# Patient Record
Sex: Male | Born: 1975 | Race: Black or African American | Hispanic: No | Marital: Single | Smoking: Former smoker
Health system: Southern US, Community
[De-identification: ages and names within clinical notes are randomized; demographics above are authoritative.]

## PROBLEM LIST (undated history)

## (undated) DIAGNOSIS — F32A Depression, unspecified: Secondary | ICD-10-CM

## (undated) DIAGNOSIS — T7840XA Allergy, unspecified, initial encounter: Secondary | ICD-10-CM

## (undated) DIAGNOSIS — J189 Pneumonia, unspecified organism: Secondary | ICD-10-CM

## (undated) DIAGNOSIS — L309 Dermatitis, unspecified: Secondary | ICD-10-CM

## (undated) DIAGNOSIS — J45909 Unspecified asthma, uncomplicated: Secondary | ICD-10-CM

## (undated) DIAGNOSIS — F419 Anxiety disorder, unspecified: Secondary | ICD-10-CM

## (undated) DIAGNOSIS — F329 Major depressive disorder, single episode, unspecified: Secondary | ICD-10-CM

## (undated) HISTORY — DX: Pneumonia, unspecified organism: J18.9

## (undated) HISTORY — DX: Dermatitis, unspecified: L30.9

## (undated) HISTORY — DX: Depression, unspecified: F32.A

## (undated) HISTORY — PX: NO PAST SURGERIES: SHX2092

## (undated) HISTORY — DX: Allergy, unspecified, initial encounter: T78.40XA

## (undated) HISTORY — DX: Anxiety disorder, unspecified: F41.9

---
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
## 1998-09-15 ENCOUNTER — Emergency Department (HOSPITAL_COMMUNITY): Admission: EM | Admit: 1998-09-15 | Discharge: 1998-09-15 | Payer: Self-pay | Admitting: Emergency Medicine

## 1999-12-26 ENCOUNTER — Inpatient Hospital Stay (HOSPITAL_COMMUNITY): Admission: EM | Admit: 1999-12-26 | Discharge: 1999-12-27 | Payer: Self-pay | Admitting: Emergency Medicine

## 1999-12-26 ENCOUNTER — Encounter: Payer: Self-pay | Admitting: Emergency Medicine

## 2001-07-25 ENCOUNTER — Emergency Department (HOSPITAL_COMMUNITY): Admission: EM | Admit: 2001-07-25 | Discharge: 2001-07-25 | Payer: Self-pay | Admitting: Emergency Medicine

## 2002-06-07 ENCOUNTER — Emergency Department (HOSPITAL_COMMUNITY): Admission: EM | Admit: 2002-06-07 | Discharge: 2002-06-07 | Payer: Self-pay | Admitting: Emergency Medicine

## 2005-07-10 ENCOUNTER — Emergency Department (HOSPITAL_COMMUNITY): Admission: EM | Admit: 2005-07-10 | Discharge: 2005-07-10 | Payer: Self-pay | Admitting: Emergency Medicine

## 2006-10-17 ENCOUNTER — Emergency Department (HOSPITAL_COMMUNITY): Admission: EM | Admit: 2006-10-17 | Discharge: 2006-10-17 | Payer: Self-pay | Admitting: Emergency Medicine

## 2008-08-20 ENCOUNTER — Emergency Department (HOSPITAL_COMMUNITY): Admission: EM | Admit: 2008-08-20 | Discharge: 2008-08-20 | Payer: Self-pay | Admitting: Emergency Medicine

## 2010-02-07 ENCOUNTER — Emergency Department (HOSPITAL_COMMUNITY): Admission: EM | Admit: 2010-02-07 | Discharge: 2010-02-07 | Payer: Self-pay | Admitting: Emergency Medicine

## 2010-10-03 ENCOUNTER — Emergency Department (HOSPITAL_COMMUNITY): Admission: EM | Admit: 2010-10-03 | Discharge: 2010-10-03 | Payer: Self-pay | Admitting: Emergency Medicine

## 2011-03-16 LAB — CBC
HCT: 43.8 % (ref 39.0–52.0)
Hemoglobin: 14.9 g/dL (ref 13.0–17.0)
MCHC: 34.1 g/dL (ref 30.0–36.0)
MCV: 97.7 fL (ref 78.0–100.0)
Platelets: 203 10*3/uL (ref 150–400)
RBC: 4.48 MIL/uL (ref 4.22–5.81)
RDW: 13.4 % (ref 11.5–15.5)
WBC: 6.6 10*3/uL (ref 4.0–10.5)

## 2011-03-16 LAB — DIFFERENTIAL
Eosinophils Absolute: 0.2 10*3/uL (ref 0.0–0.7)
Lymphocytes Relative: 35 % (ref 12–46)
Lymphs Abs: 2.3 10*3/uL (ref 0.7–4.0)
Neutrophils Relative %: 57 % (ref 43–77)

## 2011-03-16 LAB — POCT I-STAT, CHEM 8
BUN: 11 mg/dL (ref 6–23)
Creatinine, Ser: 0.9 mg/dL (ref 0.4–1.5)
Hemoglobin: 15.3 g/dL (ref 13.0–17.0)
Potassium: 4.1 mEq/L (ref 3.5–5.1)
Sodium: 138 mEq/L (ref 135–145)

## 2013-01-05 ENCOUNTER — Encounter (HOSPITAL_COMMUNITY): Payer: Self-pay | Admitting: Emergency Medicine

## 2013-01-05 ENCOUNTER — Emergency Department (HOSPITAL_COMMUNITY): Payer: Self-pay

## 2013-01-05 ENCOUNTER — Emergency Department (HOSPITAL_COMMUNITY)
Admission: EM | Admit: 2013-01-05 | Discharge: 2013-01-05 | Disposition: A | Discharge status: Home / Self Care | Payer: Self-pay | Attending: Emergency Medicine | Admitting: Emergency Medicine

## 2013-01-05 DIAGNOSIS — R0609 Other forms of dyspnea: Secondary | ICD-10-CM | POA: Insufficient documentation

## 2013-01-05 DIAGNOSIS — J3489 Other specified disorders of nose and nasal sinuses: Secondary | ICD-10-CM | POA: Insufficient documentation

## 2013-01-05 DIAGNOSIS — R51 Headache: Secondary | ICD-10-CM | POA: Insufficient documentation

## 2013-01-05 DIAGNOSIS — Z79899 Other long term (current) drug therapy: Secondary | ICD-10-CM | POA: Insufficient documentation

## 2013-01-05 DIAGNOSIS — R059 Cough, unspecified: Secondary | ICD-10-CM | POA: Insufficient documentation

## 2013-01-05 DIAGNOSIS — R05 Cough: Secondary | ICD-10-CM | POA: Insufficient documentation

## 2013-01-05 DIAGNOSIS — J45909 Unspecified asthma, uncomplicated: Secondary | ICD-10-CM | POA: Insufficient documentation

## 2013-01-05 DIAGNOSIS — R0602 Shortness of breath: Secondary | ICD-10-CM | POA: Insufficient documentation

## 2013-01-05 DIAGNOSIS — R0989 Other specified symptoms and signs involving the circulatory and respiratory systems: Secondary | ICD-10-CM | POA: Insufficient documentation

## 2013-01-05 HISTORY — DX: Unspecified asthma, uncomplicated: J45.909

## 2013-01-05 MED ORDER — PREDNISONE 20 MG PO TABS
60.0000 mg | ORAL_TABLET | ORAL | Status: AC
  Administered 2013-01-05: 60 mg via ORAL
  Filled 2013-01-05: qty 3

## 2013-01-05 MED ORDER — ALBUTEROL SULFATE HFA 108 (90 BASE) MCG/ACT IN AERS
2.0000 | INHALATION_SPRAY | RESPIRATORY_TRACT | Status: DC
  Administered 2013-01-05: 2 via RESPIRATORY_TRACT
  Filled 2013-01-05: qty 6.7

## 2013-01-05 MED ORDER — PREDNISONE 20 MG PO TABS: 60.0000 mg | ORAL_TABLET | Freq: Every day | ORAL | Status: AC

## 2013-01-05 MED ORDER — ALBUTEROL SULFATE (5 MG/ML) 0.5% IN NEBU
2.5000 mg | INHALATION_SOLUTION | RESPIRATORY_TRACT | Status: AC
  Administered 2013-01-05: 2.5 mg via RESPIRATORY_TRACT
  Filled 2013-01-05: qty 1

## 2013-01-05 MED ORDER — TRAMADOL HCL 50 MG PO TABS: 50.0000 mg | ORAL_TABLET | Freq: Four times a day (QID) | ORAL | Status: DC | PRN

## 2013-01-05 NOTE — ED Notes (Signed)
Noted onset of frontal forehead pain when coughing on Thursday, Rx'ed w. OTC DayQuil Max Cold & Flu which helped chills and fever and head fullness. Pt states he continues to fill like he cannot take a full deep breath and that his abd is distended.

## 2013-01-05 NOTE — Discharge Instructions (Signed)
As discussed, it is important that you follow up as soon as possible with your physician for continued management of your condition. ° °If you develop any new, or concerning changes in your condition, please return to the emergency department immediately. ° °

## 2013-01-05 NOTE — ED Provider Notes (Signed)
History     CSN: [id number]  Arrival date & time 01/05/13  1615   First MD Initiated Contact with Patient 01/05/13 1639      Chief Complaint  Patient presents with  . Cough  . Shortness of Breath    (Consider location/radiation/quality/duration/timing/severity/associated sxs/prior treatment) HPI The patient presents with concerns of ongoing dyspnea, cough, congestion, generalized discomfort.  Symptoms began approximately 3 days ago, subtly.  Since onset symptoms began significant worse, but over the past day the diffuse discomfort has improved somewhat.  The patient has been taking DayQuil. There were no clear exacerbating factors. The patient notes that he has had increasing cough, congestion, difficulty taking full inspiration. He denies focal chest pain, focal pain anywhere, lightheadedness, syncope, nausea, vomiting, diarrhea. Past Medical History  Diagnosis Date  . Asthma     History reviewed. No pertinent past surgical history.  No family history on file.  History  Substance Use Topics  . Smoking status: Never Smoker   . Smokeless tobacco: Never Used  . Alcohol Use: Yes     Comment: ocassionally beer and/or liquor      Review of Systems  Constitutional:       Per HPI, otherwise negative  HENT:       Per HPI, otherwise negative  Eyes: Negative.   Respiratory:       Per HPI, otherwise negative  Cardiovascular:       Per HPI, otherwise negative  Gastrointestinal: Negative for vomiting.  Genitourinary: Negative.   Musculoskeletal:       Per HPI, otherwise negative  Skin: Negative.   Neurological: Negative for syncope.    Allergies  Review of patient's allergies indicates no known allergies.  Home Medications   Current Outpatient Rx  Name  Route  Sig  Dispense  Refill  . EPHEDRINE-GUAIFENESIN 12.5-200 MG PO TABS   Oral   Take 1 tablet by mouth as needed. Asthma/breathing         . PSEUDOEPHEDRINE-APAP-DM 40-981-19 MG/30ML PO LIQD   Oral   Take  20 mLs by mouth every 6 (six) hours as needed. Cold symptons           BP 132/82  Pulse 105  Temp 98.6 F (37 C) (Oral)  Resp 20  SpO2 97%  Physical Exam  Nursing note and vitals reviewed. Constitutional: He is oriented to person, place, and time. He appears well-developed. No distress.  HENT:  Head: Normocephalic and atraumatic.  Eyes: Conjunctivae normal and EOM are normal.  Cardiovascular: Regular rhythm.  Tachycardia present.   Pulmonary/Chest: No stridor. Tachypnea noted. No respiratory distress. He has decreased breath sounds.  Abdominal: He exhibits no distension.  Musculoskeletal: He exhibits no edema.  Neurological: He is alert and oriented to person, place, and time.  Skin: Skin is warm and dry.  Psychiatric: He has a normal mood and affect.    ED Course  Procedures (including critical care time)  Labs Reviewed - No data to display Dg Chest 2 View  01/05/2013  *RADIOLOGY REPORT*  Clinical Data: Chest pain, cough.  CHEST - 2 VIEW  Comparison: 02/07/2010  Findings: Lungs clear.  Heart size and pulmonary vascularity normal.  No effusion.  Visualized bones unremarkable.  IMPRESSION: No acute disease   Original Report Authenticated By: D. Andria Rhein, MD      No diagnosis found.  Chest x-ray interpreted by me, no notable abnormalities.    MDM  This young male with history of asthma presents with days of generalized  discomfort, ongoing cough, congestion, difficulty with deep inspiration.  Patient does not smoke, has no risk factors for PE.  The patient also has no notable risk factors for CAD. Given his history of asthma, the ongoing dyspnea, or suspicion of reactive air disease, viral illness.  The patient improved with nebulizer treatment.  He was started on steroids, scheduled meds: Discharged in stable condition with PMD followup.      Gerhard Munch, MD 01/05/13 1902

## 2014-08-21 ENCOUNTER — Encounter (HOSPITAL_COMMUNITY): Payer: Self-pay | Admitting: Emergency Medicine

## 2014-08-21 ENCOUNTER — Emergency Department (HOSPITAL_COMMUNITY)
Admission: EM | Admit: 2014-08-21 | Discharge: 2014-08-21 | Disposition: A | Discharge status: Home / Self Care | Payer: Self-pay | Attending: Family Medicine | Admitting: Family Medicine

## 2014-08-21 DIAGNOSIS — J45909 Unspecified asthma, uncomplicated: Secondary | ICD-10-CM | POA: Insufficient documentation

## 2014-08-21 DIAGNOSIS — Y9289 Other specified places as the place of occurrence of the external cause: Secondary | ICD-10-CM | POA: Insufficient documentation

## 2014-08-21 DIAGNOSIS — X58XXXA Exposure to other specified factors, initial encounter: Secondary | ICD-10-CM | POA: Insufficient documentation

## 2014-08-21 DIAGNOSIS — S61002A Unspecified open wound of left thumb without damage to nail, initial encounter: Secondary | ICD-10-CM

## 2014-08-21 DIAGNOSIS — S61209A Unspecified open wound of unspecified finger without damage to nail, initial encounter: Secondary | ICD-10-CM | POA: Insufficient documentation

## 2014-08-21 DIAGNOSIS — Y9389 Activity, other specified: Secondary | ICD-10-CM | POA: Insufficient documentation

## 2014-08-21 DIAGNOSIS — Y990 Civilian activity done for income or pay: Secondary | ICD-10-CM | POA: Insufficient documentation

## 2014-08-21 DIAGNOSIS — Z23 Encounter for immunization: Secondary | ICD-10-CM | POA: Insufficient documentation

## 2014-08-21 MED ORDER — MUPIROCIN 2 % EX OINT: 1.0000 "application " | TOPICAL_OINTMENT | Freq: Two times a day (BID) | CUTANEOUS | Status: DC

## 2014-08-21 MED ORDER — HYDROCODONE-ACETAMINOPHEN 5-325 MG PO TABS: 1.0000 | ORAL_TABLET | Freq: Four times a day (QID) | ORAL | Status: DC | PRN

## 2014-08-21 MED ORDER — TETANUS-DIPHTH-ACELL PERTUSSIS 5-2.5-18.5 LF-MCG/0.5 IM SUSP
0.5000 mL | Freq: Once | INTRAMUSCULAR | Status: AC
  Administered 2014-08-21: 0.5 mL via INTRAMUSCULAR
  Filled 2014-08-21: qty 0.5

## 2014-08-21 NOTE — ED Notes (Signed)
Pt c/o laceration to left thumb tip; bleeding controlled; unknown last DT

## 2014-08-21 NOTE — ED Provider Notes (Signed)
CSN: [id number]     Arrival date & time 08/21/14  1446 History   First MD Initiated Contact with Patient 08/21/14 1552     Chief Complaint  Patient presents with  . Extremity Laceration     (Consider location/radiation/quality/duration/timing/severity/associated sxs/prior Treatment) HPI Comments: 38 year old male presents for evaluation of a laceration to the tip of his left thumb. Earlier today he sliced the tip of his thumb off at work. It is painful and was bleeding, but the bleeding has been controlled with direct pressure. He is not taking any blood thinners. He denies any other injury.   Past Medical History  Diagnosis Date  . Asthma    History reviewed. No pertinent past surgical history. History reviewed. No pertinent family history. History  Substance Use Topics  . Smoking status: Never Smoker   . Smokeless tobacco: Never Used  . Alcohol Use: Yes     Comment: ocassionally beer and/or liquor    Review of Systems  Skin: Positive for wound (see history of present illness).  All other systems reviewed and are negative.     Allergies  Review of patient's allergies indicates no known allergies.  Home Medications   Prior to Admission medications   Not on File   BP 121/67  Pulse 70  Temp(Src) 97.9 F (36.6 C) (Oral)  Resp 18  SpO2 96% Physical Exam  Nursing note and vitals reviewed. Constitutional: He is oriented to person, place, and time. He appears well-developed and well-nourished. No distress.  HENT:  Head: Normocephalic.  Pulmonary/Chest: Effort normal. No respiratory distress.  Musculoskeletal:       Left hand: He exhibits laceration (avulsed tip left thumb,  subcutaneous tissue exposed underneath). He exhibits normal capillary refill. Normal sensation noted. Normal strength noted.  Neurological: He is alert and oriented to person, place, and time. Coordination normal.  Skin: Skin is warm and dry. No rash noted. He is not diaphoretic.  Psychiatric: He  has a normal mood and affect. Judgment normal.    ED Course  Procedures (including critical care time) Labs Review Labs Reviewed - No data to display  Imaging Review No results found.   EKG Interpretation None      MDM   Final diagnoses:  None    Digital block with 5 mL of 2% lidocaine without epinephrine. The wound bed was cauterized with silver nitrate and dressed with Xeroform gauze. Patient instructed in proper wound care. He will keep it clean, dry, covered. Antibiotic ointment to use for a few days.  Meds ordered this encounter  Medications  . Tdap (BOOSTRIX) injection 0.5 mL    Sig:   . mupirocin ointment (BACTROBAN) 2 %    Sig: Apply 1 application topically 2 (two) times daily.    Dispense:  15 g    Refill:  0    Order Specific Question:  Supervising Provider    Answer:  Billy Fischer [phone]  . HYDROcodone-acetaminophen (NORCO) 5-325 MG per tablet    Sig: Take 1 tablet by mouth every 6 (six) hours as needed for moderate pain.    Dispense:  10 tablet    Refill:  0    Order Specific Question:  Supervising Provider    Answer:  Ihor Gully D Luna, PA-C 08/21/14 [phone]

## 2014-08-21 NOTE — Discharge Instructions (Signed)
Fingertip Injuries and Amputations °Fingertip injuries are common and often get injured because they are last to escape when pulling your hand out of harm's way. You have amputated (cut off) part of your finger. How this turns out depends largely on how much was amputated. If just the tip is amputated, often the end of the finger will grow back and the finger may return to much the same as it was before the injury.  °If more of the finger is missing, your caregiver has done the best with the tissue remaining to allow you to keep as much finger as is possible. Your caregiver after checking your injury has tried to leave you with a painless fingertip that has durable, feeling skin. If possible, your caregiver has tried to maintain the finger's length and appearance and preserve its fingernail.  °Please read the instructions outlined below and refer to this sheet in the next few weeks. These instructions provide you with general information on caring for yourself. Your caregiver may also give you specific instructions. While your treatment has been done according to the most current medical practices available, unavoidable complications occasionally occur. If you have any problems or questions after discharge, please call your caregiver. °HOME CARE INSTRUCTIONS  °· You may resume normal diet and activities as directed or allowed. °· Keep your hand elevated above the level of your heart. This helps decrease pain and swelling. °· Keep ice packs (or a bag of ice wrapped in a towel) on the injured area for 15-20 minutes, 03-04 times per day, for the first two days. °· Change dressings if necessary or as directed. °· Clean the wound daily or as directed. °· Only take over-the-counter or prescription medicines for pain, discomfort, or fever as directed by your caregiver. °· Keep appointments as directed. °SEEK IMMEDIATE MEDICAL CARE IF: °· You develop redness, swelling, numbness or increasing pain in the wound. °· There is  pus coming from the wound. °· You develop an unexplained oral temperature above 102° F (38.9° C) or as your caregiver suggests. °· There is a foul (bad) smell coming from the wound or dressing. °· There is a breaking open of the wound (edges not staying together) after sutures or staples have been removed. °MAKE SURE YOU:  °· Understand these instructions. °· Will watch your condition. °· Will get help right away if you are not doing well or get worse. °Document Released: 11/01/2005 Document Revised: 03/04/2012 Document Reviewed: 09/30/2008 °[employer]® Patient Information ©2015 [employer]. This information is not intended to replace advice given to you by your health care provider. Make sure you discuss any questions you have with your health care provider. ° °

## 2014-08-25 NOTE — ED Provider Notes (Signed)
Medical screening examination/treatment/procedure(s) were performed by resident physician or non-physician practitioner and as supervising physician I was immediately available for consultation/collaboration.   Pauline Good MD.   Billy Fischer, MD 08/25/14 1228

## 2015-03-14 ENCOUNTER — Emergency Department (HOSPITAL_COMMUNITY)
Admission: EM | Admit: 2015-03-14 | Discharge: 2015-03-14 | Disposition: A | Discharge status: Home / Self Care | Payer: BLUE CROSS/BLUE SHIELD | Attending: Emergency Medicine | Admitting: Emergency Medicine

## 2015-03-14 ENCOUNTER — Encounter (HOSPITAL_COMMUNITY): Payer: Self-pay | Admitting: *Deleted

## 2015-03-14 DIAGNOSIS — T23131A Burn of first degree of multiple right fingers (nail), not including thumb, initial encounter: Secondary | ICD-10-CM | POA: Insufficient documentation

## 2015-03-14 DIAGNOSIS — Y998 Other external cause status: Secondary | ICD-10-CM | POA: Insufficient documentation

## 2015-03-14 DIAGNOSIS — J45909 Unspecified asthma, uncomplicated: Secondary | ICD-10-CM | POA: Insufficient documentation

## 2015-03-14 DIAGNOSIS — T23091A Burn of unspecified degree of multiple sites of right wrist and hand, initial encounter: Secondary | ICD-10-CM | POA: Diagnosis present

## 2015-03-14 DIAGNOSIS — T23101A Burn of first degree of right hand, unspecified site, initial encounter: Secondary | ICD-10-CM

## 2015-03-14 DIAGNOSIS — Z792 Long term (current) use of antibiotics: Secondary | ICD-10-CM | POA: Insufficient documentation

## 2015-03-14 DIAGNOSIS — Y93G3 Activity, cooking and baking: Secondary | ICD-10-CM | POA: Diagnosis not present

## 2015-03-14 DIAGNOSIS — Y929 Unspecified place or not applicable: Secondary | ICD-10-CM | POA: Insufficient documentation

## 2015-03-14 DIAGNOSIS — X12XXXA Contact with other hot fluids, initial encounter: Secondary | ICD-10-CM | POA: Diagnosis not present

## 2015-03-14 MED ORDER — HYDROCODONE-ACETAMINOPHEN 5-325 MG PO TABS: 1.0000 | ORAL_TABLET | ORAL | Status: DC | PRN

## 2015-03-14 MED ORDER — SILVER SULFADIAZINE 1 % EX CREA
TOPICAL_CREAM | Freq: Once | CUTANEOUS | Status: AC
  Administered 2015-03-14: 21:00:00 via TOPICAL
  Filled 2015-03-14: qty 85

## 2015-03-14 MED ORDER — OXYCODONE-ACETAMINOPHEN 5-325 MG PO TABS
1.0000 | ORAL_TABLET | Freq: Once | ORAL | Status: AC
  Administered 2015-03-14: 1 via ORAL
  Filled 2015-03-14 (×2): qty 1

## 2015-03-14 NOTE — ED Provider Notes (Signed)
CSN: [id number]     Arrival date & time 03/14/15  1930 History  This chart was scribed for non-physician practitioner, Lucien Mons, PA-C,working with Noemi Chapel, MD, by Marlowe Kays, ED Scribe. This patient was seen in room TR08C/TR08C and the patient's care was started at 8:03 PM.  Chief Complaint  Patient presents with  . Hand Burn   The history is provided by the patient and medical records. No language interpreter was used.    HPI Comments:  William Bowman is a 39 y.o. male who presents to the Emergency Department complaining of a burn from grease while cooking to his right hand approximately 45 minutes ago. He reports severe pain. He has placed ice to the area with some relief of the pain. Denies modifying factors. Denies numbness, tingling or weakness of the right hand, fever, chills, nausea or vomiting. He states his tetanus vaccination is UTD 08/2014. Pt is right-hand dominant. PMHx of asthma.   Past Medical History  Diagnosis Date  . Asthma    History reviewed. No pertinent past surgical history. History reviewed. No pertinent family history. History  Substance Use Topics  . Smoking status: Never Smoker   . Smokeless tobacco: Never Used  . Alcohol Use: Yes     Comment: ocassionally beer and/or liquor    Review of Systems  Constitutional: Negative for fever and chills.  Gastrointestinal: Negative for nausea and vomiting.  Skin: Positive for color change and wound.  Neurological: Negative for weakness and numbness.  All other systems reviewed and are negative.   Allergies  Review of patient's allergies indicates no known allergies.  Home Medications   Prior to Admission medications   Medication Sig Start Date End Date Taking? Authorizing Provider  HYDROcodone-acetaminophen (NORCO/VICODIN) 5-325 MG per tablet Take 1-2 tablets by mouth every 4 (four) hours as needed. 03/14/15   Carman Ching, PA-C  mupirocin ointment (BACTROBAN) 2 % Apply 1 application topically 2  (two) times daily. 08/21/14   Liam Graham, PA-C   Triage Vitals: BP 130/77 mmHg  Pulse 81  Temp(Src) 97.5 F (36.4 C) (Oral)  Resp 22  SpO2 99% Physical Exam  Constitutional: He is oriented to person, place, and time. He appears well-developed and well-nourished. No distress.  HENT:  Head: Normocephalic and atraumatic.  Eyes: Conjunctivae and EOM are normal.  Neck: Normal range of motion. Neck supple.  Cardiovascular: Normal rate, regular rhythm and normal heart sounds.   Pulmonary/Chest: Effort normal and breath sounds normal.  Musculoskeletal: Normal range of motion. He exhibits no edema.  Neurological: He is alert and oriented to person, place, and time.  Skin: Skin is warm and dry.  Erythema to the top part of patient's palm, erythema and small amount of blistering to the distal tip of all fingers. Skin intact. Tender. Two point discrimination intact. Cap refill < 3 seconds.  Psychiatric: He has a normal mood and affect. His behavior is normal.  Nursing note and vitals reviewed.   ED Course  Procedures (including critical care time) DIAGNOSTIC STUDIES: Oxygen Saturation is 99% on RA, normal by my interpretation.   COORDINATION OF CARE: 8:06 PM- Will prescribe pain medication and silvadene cream, NSAIDs, ice. Pt verbalizes understanding and agrees to plan.  Medications  silver sulfADIAZINE (SILVADENE) 1 % cream (not administered)    Labs Review Labs Reviewed - No data to display  Imaging Review No results found.   EKG Interpretation None      MDM   Final diagnoses:  Burn of  right hand including fingers, first degree, initial encounter   Neurovascularly intact. Stable for d/c. Return precautions given. Patient states understanding of treatment care plan and is agreeable.  I personally performed the services described in this documentation, which was scribed in my presence. The recorded information has been reviewed and is accurate.    Carman Ching,  PA-C 03/14/15 2029  Noemi Chapel, MD 03/15/15 [phone]

## 2015-03-14 NOTE — Discharge Instructions (Signed)
Take Vicodin for severe pain only. No driving or operating heavy machinery while taking vicodin. This medication may cause drowsiness. Apply silvadene cream twice daily. Continue to ice your hand. Take ibuprofen 60-800 mg every 6-8 hours.  Burn Care Your skin is a natural barrier to infection. It is the largest organ of your body. Burns damage this natural protection. To help prevent infection, it is very important to follow your caregiver's instructions in the care of your burn. Burns are classified as:  First degree. There is only redness of the skin (erythema). No scarring is expected.  Second degree. There is blistering of the skin. Scarring may occur with deeper burns.  Third degree. All layers of the skin are injured, and scarring is expected. HOME CARE INSTRUCTIONS   Wash your hands well before changing your bandage.  Change your bandage as often as directed by your caregiver.  Remove the old bandage. If the bandage sticks, you may soak it off with cool, clean water.  Cleanse the burn thoroughly but gently with mild soap and water.  Pat the area dry with a clean, dry cloth.  Apply a thin layer of antibacterial cream to the burn.  Apply a clean bandage as instructed by your caregiver.  Keep the bandage as clean and dry as possible.  Elevate the affected area for the first 24 hours, then as instructed by your caregiver.  Only take over-the-counter or prescription medicines for pain, discomfort, or fever as directed by your caregiver. SEEK IMMEDIATE MEDICAL CARE IF:   You develop excessive pain.  You develop redness, tenderness, swelling, or red streaks near the burn.  The burned area develops yellowish-white fluid (pus) or a bad smell.  You have a fever. MAKE SURE YOU:   Understand these instructions.  Will watch your condition.  Will get help right away if you are not doing well or get worse. Document Released: 12/11/2005 Document Revised: 03/04/2012 Document  Reviewed: 05/03/2011 [facility] Patient Information 2015 [place], [place]. This information is not intended to replace advice given to you by your health care provider. Make sure you discuss any questions you have with your health care provider.

## 2015-03-14 NOTE — ED Notes (Signed)
Pt in c/o burn to his right hand, states he had hot grease splatter his hand, skin intact with some blisters noted

## 2015-04-12 ENCOUNTER — Emergency Department (HOSPITAL_COMMUNITY)
Admission: EM | Admit: 2015-04-12 | Discharge: 2015-04-13 | Disposition: A | Discharge status: Home / Self Care | Payer: BLUE CROSS/BLUE SHIELD | Attending: Emergency Medicine | Admitting: Emergency Medicine

## 2015-04-12 ENCOUNTER — Encounter (HOSPITAL_COMMUNITY): Payer: Self-pay | Admitting: Emergency Medicine

## 2015-04-12 ENCOUNTER — Emergency Department (HOSPITAL_COMMUNITY): Payer: BLUE CROSS/BLUE SHIELD

## 2015-04-12 DIAGNOSIS — Z792 Long term (current) use of antibiotics: Secondary | ICD-10-CM | POA: Diagnosis not present

## 2015-04-12 DIAGNOSIS — J4531 Mild persistent asthma with (acute) exacerbation: Secondary | ICD-10-CM | POA: Diagnosis not present

## 2015-04-12 DIAGNOSIS — J4541 Moderate persistent asthma with (acute) exacerbation: Secondary | ICD-10-CM

## 2015-04-12 DIAGNOSIS — J45909 Unspecified asthma, uncomplicated: Secondary | ICD-10-CM | POA: Diagnosis present

## 2015-04-12 DIAGNOSIS — J22 Unspecified acute lower respiratory infection: Secondary | ICD-10-CM | POA: Diagnosis not present

## 2015-04-12 LAB — CBC WITH DIFFERENTIAL/PLATELET
BASOS ABS: 0 10*3/uL (ref 0.0–0.1)
BASOS ABS: 0 10*3/uL (ref 0.0–0.1)
Basophils Relative: 0 % (ref 0–1)
Basophils Relative: 0 % (ref 0–1)
EOS PCT: 0 % (ref 0–5)
EOS PCT: 0 % (ref 0–5)
Eosinophils Absolute: 0 10*3/uL (ref 0.0–0.7)
Eosinophils Absolute: 0 10*3/uL (ref 0.0–0.7)
HCT: 41.6 % (ref 39.0–52.0)
HEMATOCRIT: 41.4 % (ref 39.0–52.0)
HEMOGLOBIN: 14.5 g/dL (ref 13.0–17.0)
Hemoglobin: 14.1 g/dL (ref 13.0–17.0)
LYMPHS ABS: 0.8 10*3/uL (ref 0.7–4.0)
LYMPHS PCT: 12 % (ref 12–46)
LYMPHS PCT: 12 % (ref 12–46)
Lymphs Abs: 0.8 10*3/uL (ref 0.7–4.0)
MCH: 32.2 pg (ref 26.0–34.0)
MCH: 31.4 pg (ref 26.0–34.0)
MCHC: 34.9 g/dL (ref 30.0–36.0)
MCHC: 34.1 g/dL (ref 30.0–36.0)
MCV: 92.2 fL (ref 78.0–100.0)
MCV: 92.2 fL (ref 78.0–100.0)
MONO ABS: 0.3 10*3/uL (ref 0.1–1.0)
MONO ABS: 0.2 10*3/uL (ref 0.1–1.0)
MONOS PCT: 5 % (ref 3–12)
MONOS PCT: 3 % (ref 3–12)
NEUTROS ABS: 5.1 10*3/uL (ref 1.7–7.7)
NEUTROS ABS: 5.5 10*3/uL (ref 1.7–7.7)
NEUTROS PCT: 83 % — AB (ref 43–77)
Neutrophils Relative %: 85 % — ABNORMAL HIGH (ref 43–77)
PLATELETS: 176 10*3/uL (ref 150–400)
Platelets: 178 10*3/uL (ref 150–400)
RBC: 4.51 MIL/uL (ref 4.22–5.81)
RBC: 4.49 MIL/uL (ref 4.22–5.81)
RDW: 13.2 % (ref 11.5–15.5)
RDW: 13.1 % (ref 11.5–15.5)
WBC: 6.2 10*3/uL (ref 4.0–10.5)
WBC: 6.5 10*3/uL (ref 4.0–10.5)

## 2015-04-12 LAB — D-DIMER, QUANTITATIVE (NOT AT ARMC)

## 2015-04-12 MED ORDER — AZITHROMYCIN 250 MG PO TABS
500.0000 mg | ORAL_TABLET | Freq: Once | ORAL | Status: AC
  Administered 2015-04-12: 500 mg via ORAL
  Filled 2015-04-12: qty 2

## 2015-04-12 MED ORDER — IPRATROPIUM-ALBUTEROL 0.5-2.5 (3) MG/3ML IN SOLN
3.0000 mL | Freq: Once | RESPIRATORY_TRACT | Status: AC
  Administered 2015-04-12: 3 mL via RESPIRATORY_TRACT
  Filled 2015-04-12: qty 3

## 2015-04-12 MED ORDER — IPRATROPIUM-ALBUTEROL 0.5-2.5 (3) MG/3ML IN SOLN
3.0000 mL | Freq: Once | RESPIRATORY_TRACT | Status: AC
  Administered 2015-04-12: 3 mL via RESPIRATORY_TRACT

## 2015-04-12 MED ORDER — IPRATROPIUM-ALBUTEROL 0.5-2.5 (3) MG/3ML IN SOLN
RESPIRATORY_TRACT | Status: AC
  Administered 2015-04-12: 3 mL
  Filled 2015-04-12: qty 3

## 2015-04-12 MED ORDER — SODIUM CHLORIDE 0.9 % IV BOLUS (SEPSIS)
1000.0000 mL | Freq: Once | INTRAVENOUS | Status: AC
  Administered 2015-04-12: 1000 mL via INTRAVENOUS

## 2015-04-12 MED ORDER — IPRATROPIUM-ALBUTEROL 0.5-2.5 (3) MG/3ML IN SOLN
3.0000 mL | Freq: Once | RESPIRATORY_TRACT | Status: AC
  Administered 2015-04-13: 3 mL via RESPIRATORY_TRACT
  Filled 2015-04-12: qty 3

## 2015-04-12 MED ORDER — PREDNISONE 20 MG PO TABS
60.0000 mg | ORAL_TABLET | Freq: Once | ORAL | Status: AC
Start: 2015-04-12 — End: 2015-04-12
  Administered 2015-04-12: 60 mg via ORAL
  Filled 2015-04-12: qty 3

## 2015-04-12 NOTE — ED Notes (Signed)
Pt sts increased SOB and wheezing from asthma; pt sts hx of same; pt sts pain with cough

## 2015-04-12 NOTE — ED Notes (Signed)
Phlebotomy at the bedside  

## 2015-04-12 NOTE — ED Provider Notes (Signed)
CSN: [id number]     Arrival date & time 04/12/15  1529 History   First MD Initiated Contact with Patient 04/12/15 1858     Chief Complaint  Patient presents with  . Asthma     (Consider location/radiation/quality/duration/timing/severity/associated sxs/prior Treatment) HPI The patient reports he's had a cough for about a week. He states on the first day he had some sinus congestion and drainage and tried some over-the-counter decongestant. He reports he continued to get worse until he was having a lot of coughing as well as some body aches. He has not had a fever that he is aware of however he does report he's been having chills. The patient reports that he's been wheezing and his cough has been nonproductive. The cough is worse at night. The patient is a nonsmoker. He reports he's had asthma since he was a teenager however does not get routine maintenance care. He reports he is only needed inhalers periodically when he gets a respiratory infection. He states that he is respiratory infection typically it is in April or December. Past Medical History  Diagnosis Date  . Asthma    History reviewed. No pertinent past surgical history. History reviewed. No pertinent family history. History  Substance Use Topics  . Smoking status: Never Smoker   . Smokeless tobacco: Never Used  . Alcohol Use: Yes     Comment: ocassionally beer and/or liquor    Review of Systems 10 Systems reviewed and are negative for acute change except as noted in the HPI.   Allergies  Review of patient's allergies indicates no known allergies.  Home Medications   Prior to Admission medications   Medication Sig Start Date End Date Taking? Authorizing Provider  cetirizine (ZYRTEC) 10 MG tablet Take 10 mg by mouth daily as needed for allergies.   Yes Historical Provider, MD  HYDROcodone-acetaminophen (NORCO/VICODIN) 5-325 MG per tablet Take 1-2 tablets by mouth every 4 (four) hours as needed. Patient not taking:  Reported on 04/12/2015 03/14/15   Carman Ching, PA-C  mupirocin ointment (BACTROBAN) 2 % Apply 1 application topically 2 (two) times daily. Patient not taking: Reported on 04/12/2015 08/21/14   Liam Graham, PA-C   BP 119/69 mmHg  Pulse 108  Temp(Src) 99.3 F (37.4 C) (Oral)  Resp 29  Ht 5\' 7"  (1.702 m)  Wt 225 lb 4 oz (102.173 kg)  BMI 35.27 kg/m2  SpO2 92% Physical Exam  Constitutional: He is oriented to person, place, and time. He appears well-developed and well-nourished.  Patient is alert and nontoxic. No respiratory distress at rest.  HENT:  Head: Normocephalic and atraumatic.  Eyes: EOM are normal. Pupils are equal, round, and reactive to light.  Neck: Neck supple.  Cardiovascular: Normal rate, regular rhythm, normal heart sounds and intact distal pulses.   Pulmonary/Chest: Effort normal. He has no rales.  Patient has very coarse wheeze throughout. There is adequate air flow to the bases.  Abdominal: Soft. Bowel sounds are normal. He exhibits no distension. There is no tenderness.  Musculoskeletal: Normal range of motion. He exhibits no edema.  Neurological: He is alert and oriented to person, place, and time. He has normal strength. Coordination normal. GCS eye subscore is 4. GCS verbal subscore is 5. GCS motor subscore is 6.  Skin: Skin is warm, dry and intact.  Psychiatric: He has a normal mood and affect.    ED Course  Procedures (including critical care time) Labs Review Labs Reviewed  CBC WITH DIFFERENTIAL/PLATELET - Abnormal; Notable  for the following:    Neutrophils Relative % 83 (*)    All other components within normal limits  CBC WITH DIFFERENTIAL/PLATELET - Abnormal; Notable for the following:    Neutrophils Relative % 85 (*)    All other components within normal limits  CULTURE, BLOOD (ROUTINE X 2)  CULTURE, BLOOD (ROUTINE X 2)  D-DIMER, QUANTITATIVE  COMPREHENSIVE METABOLIC PANEL    Imaging Review Dg Chest 2 View (if Patient Has Fever And/or  Copd)  04/12/2015   CLINICAL DATA:  Asthma, chest pain, coughing  EXAM: CHEST  2 VIEW  COMPARISON:  01/05/2013  FINDINGS: Cardiomediastinal silhouette is stable. No acute infiltrate or pleural effusion. No pulmonary edema. Bony thorax is unremarkable.  IMPRESSION: No active cardiopulmonary disease.   Electronically Signed   By: Lahoma Crocker M.D.   On: 04/12/2015 16:46     EKG Interpretation   Date/Time:  Monday April 12 2015 19:02:08 EDT Ventricular Rate:  104 PR Interval:  150 QRS Duration: 84 QT Interval:  323 QTC Calculation: 425 R Axis:   -48 Text Interpretation:  Sinus tachycardia otherwise normal. no change from  previous Confirmed by Johnney Killian, MD, Jeannie Done [phone]) on 04/12/2015 7:45:27 PM     Recheck 00:15 patient now feels much improved after 3 DuoNeb's and prednisone and Zithromax. MDM   Final diagnoses:  Asthma, moderate persistent, with acute exacerbation  Lower respiratory infection (e.g., bronchitis, pneumonia, pneumonitis, pulmonitis)   D-dimer is negative. White count is not elevated and chest x-ray does not show focal pneumonia. Patient did get significant improvement with 3 DuoNeb's. He does have asthma and diffuse wheezing however at this point is not showing signs of respiratory distress and feels safe for continued outpatient management. He is counseled to return if there should be worsening or changing symptoms.    Charlesetta Shanks, MD 04/13/15 [phone]

## 2015-04-12 NOTE — ED Notes (Signed)
MD made aware of patient's VS.

## 2015-04-12 NOTE — ED Notes (Addendum)
  Pt has multiple complaints, including a "tightness and stretching" in his "core", "unable to breath" and generalized aching.    Pt sts "I feel like my whole body is cramping, mostly my right side, that is always tense and feels like I have a muscle cramp.  Then sometimes in the middle of the night I feel like it's so tight I can't even breath.  I can't get a full breath because it feels like something is pulling so hard on my right side that it's tucking it in".

## 2015-04-13 LAB — COMPREHENSIVE METABOLIC PANEL
ALK PHOS: 40 U/L (ref 39–117)
ALT: 32 U/L (ref 0–53)
ANION GAP: 11 (ref 5–15)
AST: 28 U/L (ref 0–37)
Albumin: 3.9 g/dL (ref 3.5–5.2)
BILIRUBIN TOTAL: 0.8 mg/dL (ref 0.3–1.2)
BUN: 6 mg/dL (ref 6–23)
CHLORIDE: 102 mmol/L (ref 96–112)
CO2: 22 mmol/L (ref 19–32)
Calcium: 8.6 mg/dL (ref 8.4–10.5)
Creatinine, Ser: 0.87 mg/dL (ref 0.50–1.35)
GLUCOSE: 109 mg/dL — AB (ref 70–99)
POTASSIUM: 3.8 mmol/L (ref 3.5–5.1)
SODIUM: 135 mmol/L (ref 135–145)
Total Protein: 7 g/dL (ref 6.0–8.3)

## 2015-04-13 MED ORDER — PREDNISONE 20 MG PO TABS: ORAL_TABLET | ORAL | Status: DC

## 2015-04-13 MED ORDER — ALBUTEROL SULFATE HFA 108 (90 BASE) MCG/ACT IN AERS: 2.0000 | INHALATION_SPRAY | RESPIRATORY_TRACT | Status: DC | PRN

## 2015-04-13 MED ORDER — AZITHROMYCIN 250 MG PO TABS: 250.0000 mg | ORAL_TABLET | Freq: Every day | ORAL | Status: DC

## 2015-04-13 NOTE — Discharge Instructions (Signed)
Asthma Asthma is a recurring condition in which the airways tighten and narrow. Asthma can make it difficult to breathe. It can cause coughing, wheezing, and shortness of breath. Asthma episodes, also called asthma attacks, range from minor to life-threatening. Asthma cannot be cured, but medicines and lifestyle changes can help control it. CAUSES Asthma is believed to be caused by inherited (genetic) and environmental factors, but its exact cause is unknown. Asthma may be triggered by allergens, lung infections, or irritants in the air. Asthma triggers are different for each person. Common triggers include:   Animal dander.  Dust mites.  Cockroaches.  Pollen from trees or grass.  Mold.  Smoke.  Air pollutants such as dust, household cleaners, hair sprays, aerosol sprays, paint fumes, strong chemicals, or strong odors.  Cold air, weather changes, and winds (which increase molds and pollens in the air).  Strong emotional expressions such as crying or laughing hard.  Stress.  Certain medicines (such as aspirin) or types of drugs (such as beta-blockers).  Sulfites in foods and drinks. Foods and drinks that may contain sulfites include dried fruit, potato chips, and sparkling grape juice.  Infections or inflammatory conditions such as the flu, a cold, or an inflammation of the nasal membranes (rhinitis).  Gastroesophageal reflux disease (GERD).  Exercise or strenuous activity. SYMPTOMS Symptoms may occur immediately after asthma is triggered or many hours later. Symptoms include:  Wheezing.  Excessive nighttime or early morning coughing.  Frequent or severe coughing with a common cold.  Chest tightness.  Shortness of breath. DIAGNOSIS  The diagnosis of asthma is made by a review of your medical history and a physical exam. Tests may also be performed. These may include:  Lung function studies. These tests show how much air you breathe in and out.  Allergy  tests.  Imaging tests such as X-rays. TREATMENT  Asthma cannot be cured, but it can usually be controlled. Treatment involves identifying and avoiding your asthma triggers. It also involves medicines. There are 2 classes of medicine used for asthma treatment:   Controller medicines. These prevent asthma symptoms from occurring. They are usually taken every day.  Reliever or rescue medicines. These quickly relieve asthma symptoms. They are used as needed and provide short-term relief. Your health care provider will help you create an asthma action plan. An asthma action plan is a written plan for managing and treating your asthma attacks. It includes a list of your asthma triggers and how they may be avoided. It also includes information on when medicines should be taken and when their dosage should be changed. An action plan may also involve the use of a device called a peak flow meter. A peak flow meter measures how well the lungs are working. It helps you monitor your condition. HOME CARE INSTRUCTIONS   Take medicines only as directed by your health care provider. Speak with your health care provider if you have questions about how or when to take the medicines.  Use a peak flow meter as directed by your health care provider. Record and keep track of readings.  Understand and use the action plan to help minimize or stop an asthma attack without needing to seek medical care.  Control your home environment in the following ways to help prevent asthma attacks:  Do not smoke. Avoid being exposed to secondhand smoke.  Change your heating and air conditioning filter regularly.  Limit your use of fireplaces and wood stoves.  Get rid of pests (such as roaches and  mice) and their droppings.  Throw away plants if you see mold on them.  Clean your floors and dust regularly. Use unscented cleaning products.  Try to have someone else vacuum for you regularly. Stay out of rooms while they are  being vacuumed and for a short while afterward. If you vacuum, use a dust mask from a hardware store, a double-layered or microfilter vacuum cleaner bag, or a vacuum cleaner with a HEPA filter.  Replace carpet with wood, tile, or vinyl flooring. Carpet can trap dander and dust.  Use allergy-proof pillows, mattress covers, and box spring covers.  Wash bed sheets and blankets every week in hot water and dry them in a dryer.  Use blankets that are made of polyester or cotton.  Clean bathrooms and kitchens with bleach. If possible, have someone repaint the walls in these rooms with mold-resistant paint. Keep out of the rooms that are being cleaned and painted.  Wash hands frequently. SEEK MEDICAL CARE IF:   You have wheezing, shortness of breath, or a cough even if taking medicine to prevent attacks.  The colored mucus you cough up (sputum) is thicker than usual.  Your sputum changes from clear or white to yellow, green, gray, or bloody.  You have any problems that may be related to the medicines you are taking (such as a rash, itching, swelling, or trouble breathing).  You are using a reliever medicine more than 2-3 times per week.  Your peak flow is still at 50-79% of your personal best after following your action plan for 1 hour.  You have a fever. SEEK IMMEDIATE MEDICAL CARE IF:   You seem to be getting worse and are unresponsive to treatment during an asthma attack.  You are short of breath even at rest.  You get short of breath when doing very little physical activity.  You have difficulty eating, drinking, or talking due to asthma symptoms.  You develop chest pain.  You develop a fast heartbeat.  You have a bluish color to your lips or fingernails.  You are light-headed, dizzy, or faint.  Your peak flow is less than 50% of your personal best. MAKE SURE YOU:   Understand these instructions.  Will watch your condition.  Will get help right away if you are not  doing well or get worse. Document Released: 12/11/2005 Document Revised: 04/27/2014 Document Reviewed: 07/10/2013 [facility] Patient Information 2015 [place], [place]. This information is not intended to replace advice given to you by your health care provider. Make sure you discuss any questions you have with your health care provider. Acute Bronchitis Bronchitis is inflammation of the airways that extend from the windpipe into the lungs (bronchi). The inflammation often causes mucus to develop. This leads to a cough, which is the most common symptom of bronchitis.  In acute bronchitis, the condition usually develops suddenly and goes away over time, usually in a couple weeks. Smoking, allergies, and asthma can make bronchitis worse. Repeated episodes of bronchitis may cause further lung problems.  CAUSES Acute bronchitis is most often caused by the same virus that causes a cold. The virus can spread from person to person (contagious) through coughing, sneezing, and touching contaminated objects. SIGNS AND SYMPTOMS   Cough.   Fever.   Coughing up mucus.   Body aches.   Chest congestion.   Chills.   Shortness of breath.   Sore throat.  DIAGNOSIS  Acute bronchitis is usually diagnosed through a physical exam. Your health care provider will also  ask you questions about your medical history. Tests, such as chest X-rays, are sometimes done to rule out other conditions.  TREATMENT  Acute bronchitis usually goes away in a couple weeks. Oftentimes, no medical treatment is necessary. Medicines are sometimes given for relief of fever or cough. Antibiotic medicines are usually not needed but may be prescribed in certain situations. In some cases, an inhaler may be recommended to help reduce shortness of breath and control the cough. A cool mist vaporizer may also be used to help thin bronchial secretions and make it easier to clear the chest.  HOME CARE INSTRUCTIONS  Get plenty of rest.    Drink enough fluids to keep your urine clear or pale yellow (unless you have a medical condition that requires fluid restriction). Increasing fluids may help thin your respiratory secretions (sputum) and reduce chest congestion, and it will prevent dehydration.   Take medicines only as directed by your health care provider.  If you were prescribed an antibiotic medicine, finish it all even if you start to feel better.  Avoid smoking and secondhand smoke. Exposure to cigarette smoke or irritating chemicals will make bronchitis worse. If you are a smoker, consider using nicotine gum or skin patches to help control withdrawal symptoms. Quitting smoking will help your lungs heal faster.   Reduce the chances of another bout of acute bronchitis by washing your hands frequently, avoiding people with cold symptoms, and trying not to touch your hands to your mouth, nose, or eyes.   Keep all follow-up visits as directed by your health care provider.  SEEK MEDICAL CARE IF: Your symptoms do not improve after 1 week of treatment.  SEEK IMMEDIATE MEDICAL CARE IF:  You develop an increased fever or chills.   You have chest pain.   You have severe shortness of breath.  You have bloody sputum.   You develop dehydration.  You faint or repeatedly feel like you are going to pass out.  You develop repeated vomiting.  You develop a severe headache. MAKE SURE YOU:   Understand these instructions.  Will watch your condition.  Will get help right away if you are not doing well or get worse. Document Released: 01/18/2005 Document Revised: 04/27/2014 Document Reviewed: 06/03/2013 [facility] Patient Information 2015 [place], [place]. This information is not intended to replace advice given to you by your health care provider. Make sure you discuss any questions you have with your health care provider.  Emergency Department Resource Guide 1) Find a Doctor and Pay Out of Pocket Although you  won't have to find out who is covered by your insurance plan, it is a good idea to ask around and get recommendations. You will then need to call the office and see if the doctor you have chosen will accept you as a new patient and what types of options they offer for patients who are self-pay. Some doctors offer discounts or will set up payment plans for their patients who do not have insurance, but you will need to ask so you aren't surprised when you get to your appointment.  2) Contact Your Local Health Department Not all health departments have doctors that can see patients for sick visits, but many do, so it is worth a call to see if yours does. If you don't know where your local health department is, you can check in your phone book. The CDC also has a tool to help you locate your state's health department, and many state websites also have listings  of all of their local health departments.  3) Find a [facility] If your illness is not likely to be very severe or complicated, you may want to try a walk in clinic. These are popping up all over the country in pharmacies, drugstores, and shopping centers. They're usually staffed by nurse practitioners or physician assistants that have been trained to treat common illnesses and complaints. They're usually fairly quick and inexpensive. However, if you have serious medical issues or chronic medical problems, these are probably not your best option.  No Primary Care Doctor: - Call Health Connect at  [phone] - they can help you locate a primary care doctor that  accepts your insurance, provides certain services, etc. - Physician Referral Service- [phone]  Chronic Pain Problems: Organization         Address  Phone   Notes  [facility]  [phone] Patients need to be referred by their primary care doctor.   Medication Assistance: Organization         Address  Phone   Notes  [facility] Medication [facility] [place] [address] [address] [place], [place] [address] [phone] --Must be a resident of [facility] -- Must have NO insurance coverage whatsoever (no Medicaid/ Medicare, etc.) -- The pt. MUST have a primary care doctor that directs their care regularly and follows them in the community   MedAssist  [phone]   [employer]  [phone]    Agencies that provide inexpensive medical care: Organization         Address  Phone   Notes  [place]  [phone]   [facility]    [phone]   [facility] [place], [place] [address] [phone]   [place] [address] [place] [phone]   Planned Parenthood    [phone]   [facility]    [phone]   [place] and [place] [address], [place] Phone:  [phone], Fax:  [phone] Hours of Operation:  9 am - 6 pm, M-F.  Also accepts Medicaid/Medicare and self-pay.  [facility] for [place], [address], [place] Phone: [phone], Fax: [phone]. Hours of Operation:  8:30 am - 5:30 pm, M-F.  Also accepts Medicaid and self-pay.  [facility] [place] [address], [place] Phone: [phone]   [place], [place], [place] [phone], Ext. [phone] Mondays & Thursdays: 7-9 AM.  First 15 patients are seen on a first come, first serve basis.    [place] Providers:  Organization         Address  Phone   Notes  [facility] [address], [place] [phone] Also accepts self-pay patients.  [facility] Oter[facility] Lopez [address], [place]  [phone]   [place], [address], [place] [phone]   [facility] [address] [place] [phone]   Lucianne Lei [address], [place]   [phone] Only accepts [place] Access [place] patients after they have their name applied to their card.   Self-Pay (no insurance) in [facility]:  Patent attorney   Notes  Sickle Cell Patients, [facility] [place] [phone]   [facility] [place] [phone]   [facility] [place]  [place], [address], [place] [phone]   [facility]/Dr. Osei-Bonsu  [address], [place] or [address], [place] [phone] Phone number for both Dent and [place] locations is the same.  Urgent Medical and [facility] [address] [place] [phone]   [facility] [address] [place] or [address] [phone]   [facility] [address], [place] [phone], phone; [phone], fax Sees patients 1st and 3rd Saturday of every month.  Must not qualify for public or private insurance (i.e. Medicaid, Medicare, [place] Health Choice, Veterans' Benefits)  Household income should be no more than 200% of the poverty level The clinic cannot treat you if you are pregnant or think you are pregnant  Sexually transmitted diseases are not treated at the clinic.    Dental Care: Organization         Address  Phone  Notes  [facility] Department of [facility] [place] [phone] Accepts children up to age 79 who are enrolled in [place] or [place]; pregnant women with a Medicaid card; and children who have applied for Medicaid or [place] Health Choice, but were declined, whose parents can pay a reduced fee at time of service.  [facility] Department of [facility]  [address], [place] [phone] Accepts  children up to age 7 who are enrolled in [place] or [place]; pregnant women with a Medicaid card; and children who have applied for Medicaid or [place] Health Choice, but were declined, whose parents can pay a reduced fee at time of service.  [facility] Adult [facility] Access PROGRAM  [place] [phone] Patients are seen by appointment only. Walk-ins are not accepted. [place] will see patients 78 years of age and older. Monday - Tuesday (8am-5pm) Most Wednesdays (8:30-5pm) $30 per visit, cash only  [facility] Adult Dental Access PROGRAM  [address], [facility] [phone] Patients are seen by appointment only. Walk-ins are not accepted. [place] will see patients 64 years of age and older. One Wednesday Evening (Monthly: Volunteer Based).  $30 per visit, cash only  [place]  [phone] for adults; Children under age 19, call Graduate Pediatric Dentistry at [phone]. Children aged 59-14, please call [phone] to request a pediatric application.  Dental services are provided in all areas of dental care including fillings, crowns and bridges, complete and partial dentures, implants, gum treatment, root canals, and extractions. Preventive care is also provided. Treatment is provided to both adults and children. Patients are selected via a lottery and there is often a waiting list.   [facility] [address] [place]  [phone] [email or web address]   [facility] [address] [place], [place] [phone], Ext. [phone] Second and Fourth Thursday of each month, opens at 6:30 AM; Clinic ends at 9 AM.  Patients are seen on a first-come first-served basis, and a limited number are seen during each clinic.   [facility]  [address] [place], [place] [phone]   Eligibility Requirements  You must have lived in [place], [place], or [place] counties for at least the  last three months.   You cannot be eligible for state or federal sponsored [employer], including [employer], [place], or [employer].   You generally cannot be eligible for healthcare insurance through your employer.    How to apply: Eligibility screenings are held every Tuesday and Wednesday afternoon from 1:00 pm until 4:00 pm. You do not need an appointment for the interview!  [facility] [address] [place], [place]   [place]  [place] Department  [place]  [phone]    Behavioral Health Resources in the Community: Intensive Outpatient Programs Organization         Address  Phone  Notes  [place]. [address] [place], [place] [phone]   [facility] Outpatient [address], [place], [place]   ADS: Alcohol & Drug Svcs [address] [place], [place]   [place] [address],  [place], [place] or [phone]   Substance Abuse Resources Organization         Address  Phone  Notes  Alcohol and Drug Services  [phone]   [place]  [phone]   [facility] [place]   Chinita Pester  [phone]   Residential & Outpatient Substance Abuse Program  [phone]   Psychological Services Organization         Address  Phone  Notes  [facility]  [place]  [phone]   [place] [address] [place] or [phone]    Mobile Crisis Teams Organization         Address  Phone  Notes  [facility], Mobile Crisis Care Unit  [phone]   [facility]  [address] [place], [place]   [place] Levels [address] [place] [phone]     Self-Help/Support Groups Organization         Address  Phone             Notes  [place] [employer]. of [place] - variety of support groups  [place] Call for more information  Narcotics Anonymous (NA), [facility] [address], [employer] [place]  2 meetings at this location   Special educational needs teacher         Address  Phone  Notes  [facility] Residential Treatment [place],    [place]  [phone]   [facility]  [address] [place] [phone], [place], [place]   [place], [place] [phone] Admissions: 8am-3pm M-F  Incentives Substance [place] [address]    [place], [place] [phone]   [facility] [address] Jadene Pierini [place], [place]   [facility] [address]  [place], [place]   [facility] - Intensive Outpatient [address] Kristeen [address], [place], [place]   [facility] [place].,  [place], [place] or [phone]   [facility] Services (RTS) [address], [place], [place] Accepts Medicaid  [facility]ship [place] [address],  [place] [phone] Substance Abuse/Addiction Treatment   [facility] Resources Organization  Address  Phone  Notes  [facility]  [phone]   Domenic Schwab, PhD [address] [place]lis [place], [place]   [phone] or [phone]   [place], [place] [phone]   [address], [place], [place] [phone] Insurance/Medicaid/sponsorship through [facility] and Families [address]                                    [place], [place] [phone] [facility] [address], [place] [phone]    Dr. Adele Schilder  [phone]   Free  Clinic of [place] Dept. 1) [address], [place] 2) [place] 3)  [address], [place] [phone]  [phone]   [place] [phone] or [phone] (After Hours)

## 2015-04-16 ENCOUNTER — Telehealth (HOSPITAL_BASED_OUTPATIENT_CLINIC_OR_DEPARTMENT_OTHER): Payer: Self-pay | Admitting: Emergency Medicine

## 2015-04-17 LAB — CULTURE, BLOOD (ROUTINE X 2)

## 2015-04-19 LAB — CULTURE, BLOOD (ROUTINE X 2): Culture: NO GROWTH

## 2015-09-10 ENCOUNTER — Other Ambulatory Visit: Payer: Self-pay | Admitting: Family Medicine

## 2015-09-10 DIAGNOSIS — R200 Anesthesia of skin: Secondary | ICD-10-CM

## 2015-09-10 DIAGNOSIS — M545 Low back pain: Secondary | ICD-10-CM

## 2015-09-20 ENCOUNTER — Other Ambulatory Visit: Payer: BLUE CROSS/BLUE SHIELD

## 2016-03-28 DIAGNOSIS — M545 Low back pain: Secondary | ICD-10-CM | POA: Diagnosis not present

## 2016-03-28 DIAGNOSIS — K30 Functional dyspepsia: Secondary | ICD-10-CM | POA: Diagnosis not present

## 2016-03-28 DIAGNOSIS — J4520 Mild intermittent asthma, uncomplicated: Secondary | ICD-10-CM | POA: Diagnosis not present

## 2016-03-28 DIAGNOSIS — M25559 Pain in unspecified hip: Secondary | ICD-10-CM | POA: Diagnosis not present

## 2016-07-10 DIAGNOSIS — Z833 Family history of diabetes mellitus: Secondary | ICD-10-CM | POA: Diagnosis not present

## 2016-07-10 DIAGNOSIS — Z114 Encounter for screening for human immunodeficiency virus [HIV]: Secondary | ICD-10-CM | POA: Diagnosis not present

## 2016-07-10 DIAGNOSIS — Z23 Encounter for immunization: Secondary | ICD-10-CM | POA: Diagnosis not present

## 2016-07-10 DIAGNOSIS — Z125 Encounter for screening for malignant neoplasm of prostate: Secondary | ICD-10-CM | POA: Diagnosis not present

## 2016-07-10 DIAGNOSIS — Z1211 Encounter for screening for malignant neoplasm of colon: Secondary | ICD-10-CM | POA: Diagnosis not present

## 2016-07-10 DIAGNOSIS — Z0000 Encounter for general adult medical examination without abnormal findings: Secondary | ICD-10-CM | POA: Diagnosis not present

## 2016-07-10 DIAGNOSIS — R000 Tachycardia, unspecified: Secondary | ICD-10-CM | POA: Diagnosis not present

## 2016-07-10 DIAGNOSIS — E669 Obesity, unspecified: Secondary | ICD-10-CM | POA: Diagnosis not present

## 2016-07-10 DIAGNOSIS — R002 Palpitations: Secondary | ICD-10-CM | POA: Diagnosis not present

## 2016-07-14 DIAGNOSIS — Z1211 Encounter for screening for malignant neoplasm of colon: Secondary | ICD-10-CM | POA: Diagnosis not present

## 2017-01-15 DIAGNOSIS — M9905 Segmental and somatic dysfunction of pelvic region: Secondary | ICD-10-CM | POA: Diagnosis not present

## 2017-01-15 DIAGNOSIS — M9901 Segmental and somatic dysfunction of cervical region: Secondary | ICD-10-CM | POA: Diagnosis not present

## 2017-01-15 DIAGNOSIS — M50321 Other cervical disc degeneration at C4-C5 level: Secondary | ICD-10-CM | POA: Diagnosis not present

## 2017-01-15 DIAGNOSIS — Q72812 Congenital shortening of left lower limb: Secondary | ICD-10-CM | POA: Diagnosis not present

## 2017-01-23 DIAGNOSIS — M47812 Spondylosis without myelopathy or radiculopathy, cervical region: Secondary | ICD-10-CM | POA: Diagnosis not present

## 2017-01-23 DIAGNOSIS — Q760 Spina bifida occulta: Secondary | ICD-10-CM | POA: Diagnosis not present

## 2017-01-23 DIAGNOSIS — M47816 Spondylosis without myelopathy or radiculopathy, lumbar region: Secondary | ICD-10-CM | POA: Diagnosis not present

## 2017-02-13 DIAGNOSIS — Q72812 Congenital shortening of left lower limb: Secondary | ICD-10-CM | POA: Diagnosis not present

## 2017-02-13 DIAGNOSIS — M50321 Other cervical disc degeneration at C4-C5 level: Secondary | ICD-10-CM | POA: Diagnosis not present

## 2017-02-13 DIAGNOSIS — M9901 Segmental and somatic dysfunction of cervical region: Secondary | ICD-10-CM | POA: Diagnosis not present

## 2017-02-13 DIAGNOSIS — M9905 Segmental and somatic dysfunction of pelvic region: Secondary | ICD-10-CM | POA: Diagnosis not present

## 2017-12-28 ENCOUNTER — Ambulatory Visit: Payer: Self-pay

## 2017-12-28 NOTE — Telephone Encounter (Signed)
   Reason for Disposition . Leg pain or muscle cramp is a chronic symptom (recurrent or ongoing AND present > 4 weeks)  Answer Assessment - Initial Assessment Questions 1. ONSET: "When did the pain start?"      Started 1 year ago 2. LOCATION: "Where is the pain located?"      Right leg, chest and both arms 3. PAIN: "How bad is the pain?"    (Scale 1-10; or mild, moderate, severe)   -  MILD (1-3): doesn't interfere with normal activities    -  MODERATE (4-7): interferes with normal activities (e.g., work or school) or awakens from sleep, limping    -  SEVERE (8-10): excruciating pain, unable to do any normal activities, unable to walk     4-5 4. WORK OR EXERCISE: "Has there been any recent work or exercise that involved this part of the body?"      No 5. CAUSE: "What do you think is causing the leg pain?"     Maybe poor circulation 6. OTHER SYMPTOMS: "Do you have any other symptoms?" (e.g., chest pain, back pain, breathing difficulty, swelling, rash, fever, numbness, weakness)     No 7. PREGNANCY: "Is there any chance you are pregnant?" "When was your last menstrual period?"     No  Protocols used: LEG PAIN-A-AH

## 2018-01-01 ENCOUNTER — Encounter: Payer: Self-pay | Admitting: Family Medicine

## 2018-01-01 ENCOUNTER — Ambulatory Visit: Payer: BLUE CROSS/BLUE SHIELD | Admitting: Family Medicine

## 2018-01-01 VITALS — BP 110/72 | HR 110 | Temp 98.8°F | Ht 66.0 in | Wt 222.2 lb

## 2018-01-01 DIAGNOSIS — E781 Pure hyperglyceridemia: Secondary | ICD-10-CM

## 2018-01-01 DIAGNOSIS — R5383 Other fatigue: Secondary | ICD-10-CM

## 2018-01-01 DIAGNOSIS — F419 Anxiety disorder, unspecified: Secondary | ICD-10-CM | POA: Diagnosis not present

## 2018-01-01 DIAGNOSIS — R000 Tachycardia, unspecified: Secondary | ICD-10-CM | POA: Diagnosis not present

## 2018-01-01 DIAGNOSIS — R069 Unspecified abnormalities of breathing: Secondary | ICD-10-CM

## 2018-01-01 DIAGNOSIS — Z7689 Persons encountering health services in other specified circumstances: Secondary | ICD-10-CM | POA: Diagnosis not present

## 2018-01-01 MED ORDER — FLUTICASONE PROPIONATE 50 MCG/ACT NA SUSP: 1.0000 | Freq: Every day | NASAL | 0 refills | Status: DC

## 2018-01-01 NOTE — Patient Instructions (Addendum)
Fatigue Fatigue is feeling tired all of the time, a lack of energy, or a lack of motivation. Occasional or mild fatigue is often a normal response to activity or life in general. However, long-lasting (chronic) or extreme fatigue may indicate an underlying medical condition. Follow these instructions at home: Watch your fatigue for any changes. The following actions may help to lessen any discomfort you are feeling:  Talk to your health care provider about how much sleep you need each night. Try to get the required amount every night.  Take medicines only as directed by your health care provider.  Eat a healthy and nutritious diet. Ask your health care provider if you need help changing your diet.  Drink enough fluid to keep your urine clear or pale yellow.  Practice ways of relaxing, such as yoga, meditation, massage therapy, or acupuncture.  Exercise regularly.  Change situations that cause you stress. Try to keep your work and personal routine reasonable.  Do not abuse illegal drugs.  Limit alcohol intake to no more than 1 drink per day for nonpregnant women and 2 drinks per day for men. One drink equals 12 ounces of beer, 5 ounces of wine, or 1 ounces of hard liquor.  Take a multivitamin, if directed by your health care provider.  Contact a health care provider if:  Your fatigue does not get better.  You have a fever.  You have unintentional weight loss or gain.  You have headaches.  You have difficulty: ? Falling asleep. ? Sleeping throughout the night.  You feel angry, guilty, anxious, or sad.  You are unable to have a bowel movement (constipation).  You skin is dry.  Your legs or another part of your body is swollen. Get help right away if:  You feel confused.  Your vision is blurry.  You feel faint or pass out.  You have a severe headache.  You have severe abdominal, pelvic, or back pain.  You have chest pain, shortness of breath, or an irregular or  fast heartbeat.  You are unable to urinate or you urinate less than normal.  You develop abnormal bleeding, such as bleeding from the rectum, vagina, nose, lungs, or nipples.  You vomit blood.  You have thoughts about harming yourself or committing suicide.  You are worried that you might harm someone else. This information is not intended to replace advice given to you by your health care provider. Make sure you discuss any questions you have with your health care provider. Document Released: 10/08/2007 Document Revised: 05/18/2016 Document Reviewed: 04/14/2014 Elsevier Interactive Patient Education  2018 [employer] After being diagnosed with an anxiety disorder, you may be relieved to know why you have felt or behaved a certain way. It is natural to also feel overwhelmed about the treatment ahead and what it will mean for your life. With care and support, you can manage this condition and recover from it. How to cope with anxiety Dealing with stress Stress is your body's reaction to life changes and events, both good and bad. Stress can last just a few hours or it can be ongoing. Stress can play a major role in anxiety, so it is important to learn both how to cope with stress and how to think about it differently. Talk with your health care provider or a counselor to learn more about stress reduction. He or she may suggest some stress reduction techniques, such as:  Music therapy. This can include creating or  listening to music that you enjoy and that inspires you.  Mindfulness-based meditation. This involves being aware of your normal breaths, rather than trying to control your breathing. It can be done while sitting or walking.  Centering prayer. This is a kind of meditation that involves focusing on a word, phrase, or sacred image that is meaningful to you and that brings you peace.  Deep breathing. To do this, expand your stomach and inhale slowly through  your nose. Hold your breath for 3-5 seconds. Then exhale slowly, allowing your stomach muscles to relax.  Self-talk. This is a skill where you identify thought patterns that lead to anxiety reactions and correct those thoughts.  Muscle relaxation. This involves tensing muscles then relaxing them.  Choose a stress reduction technique that fits your lifestyle and personality. Stress reduction techniques take time and practice. Set aside 5-15 minutes a day to do them. Therapists can offer training in these techniques. The training may be covered by some insurance plans. Other things you can do to manage stress include:  Keeping a stress diary. This can help you learn what triggers your stress and ways to control your response.  Thinking about how you respond to certain situations. You may not be able to control everything, but you can control your reaction.  Making time for activities that help you relax, and not feeling guilty about spending your time in this way.  Therapy combined with coping and stress-reduction skills provides the best chance for successful treatment. Medicines Medicines can help ease symptoms. Medicines for anxiety include:  Anti-anxiety drugs.  Antidepressants.  Beta-blockers.  Medicines may be used as the main treatment for anxiety disorder, along with therapy, or if other treatments are not working. Medicines should be prescribed by a health care provider. Relationships Relationships can play a big part in helping you recover. Try to spend more time connecting with trusted friends and family members. Consider going to couples counseling, taking family education classes, or going to family therapy. Therapy can help you and others better understand the condition. How to recognize changes in your condition Everyone has a different response to treatment for anxiety. Recovery from anxiety happens when symptoms decrease and stop interfering with your daily activities at  home or work. This may mean that you will start to:  Have better concentration and focus.  Sleep better.  Be less irritable.  Have more energy.  Have improved memory.  It is important to recognize when your condition is getting worse. Contact your health care provider if your symptoms interfere with home or work and you do not feel like your condition is improving. Where to find help and support: You can get help and support from these sources:  Self-help groups.  Online and [employer].  A trusted spiritual leader.  Couples counseling.  Family education classes.  Family therapy.  Follow these instructions at home:  Eat a healthy diet that includes plenty of vegetables, fruits, whole grains, low-fat dairy products, and lean protein. Do not eat a lot of foods that are high in solid fats, added sugars, or salt.  Exercise. Most adults should do the following: ? Exercise for at least 150 minutes each week. The exercise should increase your heart rate and make you sweat (moderate-intensity exercise). ? Strengthening exercises at least twice a week.  Cut down on caffeine, tobacco, alcohol, and other potentially harmful substances.  Get the right amount and quality of sleep. Most adults need 7-9 hours of sleep each night.  Make choices that simplify your life.  Take over-the-counter and prescription medicines only as told by your health care provider.  Avoid caffeine, alcohol, and certain over-the-counter cold medicines. These may make you feel worse. Ask your pharmacist which medicines to avoid.  Keep all follow-up visits as told by your health care provider. This is important. Questions to ask your health care provider  Would I benefit from therapy?  How often should I follow up with a health care provider?  How long do I need to take medicine?  Are there any long-term side effects of my medicine?  Are there any alternatives to taking medicine? Contact  a health care provider if:  You have a hard time staying focused or finishing daily tasks.  You spend many hours a day feeling worried about everyday life.  You become exhausted by worry.  You start to have headaches, feel tense, or have nausea.  You urinate more than normal.  You have diarrhea. Get help right away if:  You have a racing heart and shortness of breath.  You have thoughts of hurting yourself or others. If you ever feel like you may hurt yourself or others, or have thoughts about taking your own life, get help right away. You can go to your nearest emergency department or call:  Your local emergency services (911 in the [place]  A suicide crisis helpline, such as the [facility] at (412)719-3056. This is open 24-hours a day.  Summary  Taking steps to deal with stress can help calm you.  Medicines cannot cure anxiety disorders, but they can help ease symptoms.  Family, friends, and partners can play a big part in helping you recover from an anxiety disorder. This information is not intended to replace advice given to you by your health care provider. Make sure you discuss any questions you have with your health care provider. Document Released: 12/05/2016 Document Revised: 12/05/2016 Document Reviewed: 12/05/2016 Elsevier Interactive Patient Education  2018 [employer].  Sinus Tachycardia Sinus tachycardia is a kind of fast heartbeat. In sinus tachycardia, the heart beats more than 100 times a minute. Sinus tachycardia starts in a part of the heart called the sinus node. Sinus tachycardia may be harmless, or it may be a sign of a serious condition. What are the causes? This condition may be caused by:  Exercise or exertion.  A fever.  Pain.  Loss of body fluids (dehydration).  Severe bleeding (hemorrhage).  Anxiety and stress.  Certain substances, including: ? Alcohol. ? Caffeine. ? Tobacco and nicotine products. ? Diet  pills. ? Illegal drugs.  Medical conditions including: ? Heart disease. ? An infection. ? An overactive thyroid (hyperthyroidism). ? A lack of red blood cells (anemia).  What are the signs or symptoms? Symptoms of this condition include:  A feeling that the heart is beating quickly (palpitations).  Suddenly noticing your heartbeat (cardiac awareness).  Dizziness.  Tiredness (fatigue).  Shortness of breath.  Chest pain.  Nausea.  Fainting.  How is this diagnosed? This condition is diagnosed with:  A physical exam.  Other tests, such as: ? Blood tests. ? An electrocardiogram (ECG). This test measures the electrical activity of the heart. ? Holter monitoring. For this test, you wear a device that records your heartbeat for one or more days.  You may be referred to a heart specialist (cardiologist). How is this treated? Treatment for this condition depends on the cause or underlying condition. Treatment may involve:  Treating the underlying  condition.  Taking new medicines or changing your current medicines as told by your health care provider.  Making changes to your diet or lifestyle.  Practicing relaxation methods.  Follow these instructions at home: Lifestyle  Do not use any products that contain nicotine or tobacco, such as cigarettes and e-cigarettes. If you need help quitting, ask your health care provider.  Learn relaxation methods, like deep breathing, to help you when you get stressed or anxious.  Do not use illegal drugs, such as cocaine.  Do not abuse alcohol. Limit alcohol intake to no more than 1 drink a day for non-pregnant women and 2 drinks a day for men. One drink equals 12 oz of beer, 5 oz of wine, or 1 oz of hard liquor.  Find time to rest and relax often. This reduces stress.  Avoid: ? Caffeine. ? Stimulants such as over-the-counter diet pills or pills that help you to stay awake. ? Situations that cause anxiety or stress. General  instructions  Drink enough fluids to keep your urine clear or pale yellow.  Take over-the-counter and prescription medicines only as told by your health care provider.  Keep all follow-up visits as told by your health care provider. This is important. Contact a health care provider if:  You have a fever.  You have vomiting or diarrhea that keeps happening (is persistent). Get help right away if:  You have pain in your chest, upper arms, jaw, or neck.  You become weak or dizzy.  You feel faint.  You have palpitations that do not go away. This information is not intended to replace advice given to you by your health care provider. Make sure you discuss any questions you have with your health care provider. Document Released: 01/18/2005 Document Revised: 07/08/2016 Document Reviewed: 06/25/2015 Elsevier Interactive Patient Education  [employer].

## 2018-01-01 NOTE — Progress Notes (Signed)
Patient presents to clinic today to establish care.  SUBJECTIVE: PMH: Patient is a 42 year old male past medical history significant for triglyceridemia, tachycardia.  Patient was previously seen in July at [facility].  Patient states he wants to take control of his health this year.  Triglyceridemia: -Patient states he knows cholesterol has been high in the past. -Patient has been trying to decrease the amount of starch and carbs that he eats. -Patient is not currently exercising.  Tachycardia: -This is an ongoing issue per patient. -Over the last 2 years heart rate typically 90-110 bpm. -Pt has an app on his phone to monitor his heart rate -Patient denies dizziness, changes in vision, nausea vomiting, numbness, tingling in extremities.  Breathing problem?: -Patient states he feels like he cannot breathe in deep. -Patient "feels like not getting air in the right side of nose" -Patient endorses asthma as a child. -Was using albuterol inhaler but ran out -Patient endorses wheezing every day when walking to work -Patient denies cough at night or first thing in the morning  Tired: -Pt endorses increased fatigue. -Patient states may get 6 hours of sleep per night but feels like he needs more. 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  May go to bed around 1 or 2 in the morning but wakes up around 6:30 AM -Patient also endorses increased anxiety about everything.  Allergies: NKDA  Past surgical history: None  Social history: Patient is single.  He is currently employed as a Training and development officer at [employer].  Patient has an associates degree.  Patient denies tobacco use.  Patient endorses social alcohol use.  Patient also endorses daily marijuana use.  Patient is sexually active with men.  Family medical history: Mom-deceased, alcohol abuse, drug abuse Dad-deceased, alcohol abuse, drug abuse, HLD, HTN   Past Medical History:  Diagnosis Date  . Asthma   . Pneumonia      History reviewed. No pertinent surgical history.  Current Outpatient Medications on File Prior to Visit  Medication Sig Dispense Refill  . albuterol (PROVENTIL HFA;VENTOLIN HFA) 108 (90 BASE) MCG/ACT inhaler Inhale 2 puffs into the lungs every 2 (two) hours as needed for wheezing or shortness of breath (cough). 1 Inhaler 0  . cetirizine (ZYRTEC) 10 MG tablet Take 10 mg by mouth daily as needed for allergies.     No current facility-administered medications on file prior to visit.     No Known Allergies  History reviewed. No pertinent family history.  Social History   Socioeconomic History  . Marital status: Single    Spouse name: Not on file  . Number of children: Not on file  . Years of education: Not on file  . Highest education level: Not on file  Social Needs  . Financial resource strain: Not on file  . Food insecurity - worry: Not on file  . Food insecurity - inability: Not on file  . Transportation needs - medical: Not on file  . Transportation needs - non-medical: Not on file  Occupational History  . Not on file  Tobacco Use  . Smoking status: Never Smoker  . Smokeless tobacco: Never Used  Substance and Sexual Activity  . Alcohol use: Yes    Comment: ocassionally beer and/or liquor  . Drug use: Yes    Frequency: 2.0 times per week    Types: Marijuana  . Sexual activity: Not on file  Other Topics Concern  . Not on file  Social History Narrative  . Not on  file    ROS General: Denies fever, chills, night sweats, changes in weight, changes in appetite  + fatigue HEENT: Denies headaches, ear pain, changes in vision, rhinorrhea, sore throat CV: Denies CP, palpitations, SOB, orthopnea  + tachycardia Pulm: Denies SOB, cough, wheezing  + wheezing, breathing problems GI: Denies abdominal pain, nausea, vomiting, diarrhea, constipation GU: Denies dysuria, hematuria, frequency, vaginal discharge Msk: Denies muscle cramps, joint pains Neuro: Denies weakness,  numbness, tingling Skin: Denies rashes, bruising Psych: Denies depression, hallucinations  + anxiety  BP 110/72 (BP Location: Left Arm, Patient Position: Sitting, Cuff Size: Large)   Pulse (!) 110   Temp 98.8 F (37.1 C) (Oral)   Ht 5\' 6"  (1.676 m)   Wt 222 lb 3.2 oz (100.8 kg)   BMI 35.86 kg/m   Physical Exam Gen. Pleasant, well developed, well-nourished, in NAD HEENT - [place]/AT, PERRL, no scleral icterus, no nasal drainage, pharynx without erythema or exudate. Neck: No JVD, no thyromegaly, no carotid bruits Lungs: no use of accessory muscles, CTAB, no wheezes, rales or rhonchi Cardiovascular: RRR, No r/g/m, no peripheral edema Abdomen: BS present, soft, nontender,nondistended Musculoskeletal: No deformities, moves all four extremities, no cyanosis or clubbing, normal tone Neuro:  A&Ox3, CN II-XII intact, normal gait Skin:  Warm, dry, intact, no lesions Psych: normal affect, mood appropriate  No results found for this or any previous visit (from the past 2160 hour(s)).  Assessment/Plan: Tachycardia  -Heart rate 110 -We will obtain labs to evaluate. -Given time at next visit will obtain EKG. -If needed will consider referral to cardiology. -Patient advised to decrease caffeine intake, increase water intake, slowly increase physical activity. - Plan: Comprehensive metabolic panel, CBC with Differential/Platelet  High triglycerides  - Plan: Lipid panel  Breathing problem -History of asthma as a child -We will send for PFTs -We will start a trial of Flonase to see if breathing improves.  Fatigue, unspecified type  -Discussed various causes of fatigue. -Patient given handout -Patient encouraged to increase physical activity, get plenty of rest each night, decrease/stop marijuana use. - Plan: TSH, T4, free, Vitamin D, 25-hydroxy  Anxiety -Discussed starting counseling.  Patient open to this idea. -Patient given tips to help decrease stress -We will obtain labs to  evaluate -At next [facility] will discuss medication and obtain gad 7  Encounter to establish care -We reviewed the PMH, PSH, FH, SH, Meds and Allergies. -We provided refills for any medications we will prescribe as needed. -We addressed current concerns per orders and patient instructions. -We have asked for records for pertinent exams, studies, vaccines and notes from previous providers. -We have advised patient to follow up per instructions below.  Follow-up in 1 month, sooner if needed  Grier Mitts, MD

## 2018-01-02 ENCOUNTER — Encounter: Payer: Self-pay | Admitting: Family Medicine

## 2018-01-30 ENCOUNTER — Other Ambulatory Visit: Payer: Self-pay | Admitting: Family Medicine

## 2018-01-30 ENCOUNTER — Other Ambulatory Visit (INDEPENDENT_AMBULATORY_CARE_PROVIDER_SITE_OTHER): Payer: BLUE CROSS/BLUE SHIELD

## 2018-01-30 DIAGNOSIS — E781 Pure hyperglyceridemia: Secondary | ICD-10-CM

## 2018-01-30 DIAGNOSIS — R5383 Other fatigue: Secondary | ICD-10-CM

## 2018-01-30 DIAGNOSIS — R000 Tachycardia, unspecified: Secondary | ICD-10-CM | POA: Diagnosis not present

## 2018-01-30 DIAGNOSIS — E559 Vitamin D deficiency, unspecified: Secondary | ICD-10-CM

## 2018-01-30 LAB — CBC WITH DIFFERENTIAL/PLATELET
BASOS PCT: 0.4 % (ref 0.0–3.0)
Basophils Absolute: 0 10*3/uL (ref 0.0–0.1)
Eosinophils Absolute: 0.1 10*3/uL (ref 0.0–0.7)
Eosinophils Relative: 3 % (ref 0.0–5.0)
HEMATOCRIT: 41.5 % (ref 39.0–52.0)
HEMOGLOBIN: 14.1 g/dL (ref 13.0–17.0)
LYMPHS PCT: 29.1 % (ref 12.0–46.0)
Lymphs Abs: 1.2 10*3/uL (ref 0.7–4.0)
MCHC: 34 g/dL (ref 30.0–36.0)
MCV: 96.1 fl (ref 78.0–100.0)
Monocytes Absolute: 0.4 10*3/uL (ref 0.1–1.0)
Monocytes Relative: 9.6 % (ref 3.0–12.0)
Neutro Abs: 2.4 10*3/uL (ref 1.4–7.7)
Neutrophils Relative %: 57.9 % (ref 43.0–77.0)
PLATELETS: 213 10*3/uL (ref 150.0–400.0)
RBC: 4.32 Mil/uL (ref 4.22–5.81)
RDW: 13.6 % (ref 11.5–15.5)
WBC: 4.2 10*3/uL (ref 4.0–10.5)

## 2018-01-30 LAB — LIPID PANEL
CHOLESTEROL: 205 mg/dL — AB (ref 0–200)
HDL: 48.7 mg/dL (ref >39.00)
LDL Cholesterol: 137 mg/dL — ABNORMAL HIGH (ref 0–99)
NonHDL: 156.09
TRIGLYCERIDES: 94 mg/dL (ref 0.0–149.0)
Total CHOL/HDL Ratio: 4
VLDL: 18.8 mg/dL (ref 0.0–40.0)

## 2018-01-30 LAB — COMPREHENSIVE METABOLIC PANEL
ALBUMIN: 4.3 g/dL (ref 3.5–5.2)
ALT: 27 U/L (ref 0–53)
AST: 18 U/L (ref 0–37)
Alkaline Phosphatase: 44 U/L (ref 39–117)
BILIRUBIN TOTAL: 0.6 mg/dL (ref 0.2–1.2)
BUN: 12 mg/dL (ref 6–23)
CALCIUM: 8.9 mg/dL (ref 8.4–10.5)
CO2: 30 meq/L (ref 19–32)
CREATININE: 0.86 mg/dL (ref 0.40–1.50)
Chloride: 104 mEq/L (ref 96–112)
GFR: 124.8 mL/min (ref >60.00)
Glucose, Bld: 88 mg/dL (ref 70–99)
Potassium: 4.2 mEq/L (ref 3.5–5.1)
Sodium: 140 mEq/L (ref 135–145)
Total Protein: 7.2 g/dL (ref 6.0–8.3)

## 2018-01-30 LAB — T4, FREE: FREE T4: 0.76 ng/dL (ref 0.60–1.60)

## 2018-01-30 LAB — TSH: TSH: 0.76 u[IU]/mL (ref 0.35–4.50)

## 2018-01-30 LAB — VITAMIN D 25 HYDROXY (VIT D DEFICIENCY, FRACTURES): VITD: 9.98 ng/mL — AB (ref 30.00–100.00)

## 2018-01-30 MED ORDER — VITAMIN D (ERGOCALCIFEROL) 1.25 MG (50000 UNIT) PO CAPS: 50000.0000 [IU] | ORAL_CAPSULE | ORAL | 0 refills | Status: DC

## 2018-02-01 ENCOUNTER — Ambulatory Visit: Payer: BLUE CROSS/BLUE SHIELD | Admitting: Family Medicine

## 2018-02-01 ENCOUNTER — Encounter: Payer: Self-pay | Admitting: Family Medicine

## 2018-02-01 ENCOUNTER — Other Ambulatory Visit: Payer: Self-pay | Admitting: Family Medicine

## 2018-02-01 VITALS — BP 110/68 | HR 108 | Temp 99.2°F | Ht 66.0 in | Wt 233.0 lb

## 2018-02-01 DIAGNOSIS — E559 Vitamin D deficiency, unspecified: Secondary | ICD-10-CM

## 2018-02-01 DIAGNOSIS — R000 Tachycardia, unspecified: Secondary | ICD-10-CM | POA: Diagnosis not present

## 2018-02-01 DIAGNOSIS — L2082 Flexural eczema: Secondary | ICD-10-CM

## 2018-02-01 DIAGNOSIS — J4520 Mild intermittent asthma, uncomplicated: Secondary | ICD-10-CM

## 2018-02-01 DIAGNOSIS — J0100 Acute maxillary sinusitis, unspecified: Secondary | ICD-10-CM

## 2018-02-01 DIAGNOSIS — F17210 Nicotine dependence, cigarettes, uncomplicated: Secondary | ICD-10-CM | POA: Diagnosis not present

## 2018-02-01 DIAGNOSIS — F419 Anxiety disorder, unspecified: Secondary | ICD-10-CM

## 2018-02-01 MED ORDER — TRIAMCINOLONE ACETONIDE 0.5 % EX CREA: 1.0000 "application " | TOPICAL_CREAM | Freq: Two times a day (BID) | CUTANEOUS | 1 refills | Status: DC

## 2018-02-01 MED ORDER — AMOXICILLIN 500 MG PO CAPS: 500.0000 mg | ORAL_CAPSULE | Freq: Two times a day (BID) | ORAL | 0 refills | Status: DC

## 2018-02-01 MED ORDER — AMOXICILLIN 500 MG PO CAPS: 500.0000 mg | ORAL_CAPSULE | Freq: Two times a day (BID) | ORAL | 0 refills | Status: AC

## 2018-02-01 MED ORDER — VITAMIN D (ERGOCALCIFEROL) 1.25 MG (50000 UNIT) PO CAPS: 50000.0000 [IU] | ORAL_CAPSULE | ORAL | 0 refills | Status: DC

## 2018-02-01 MED ORDER — ALBUTEROL SULFATE HFA 108 (90 BASE) MCG/ACT IN AERS: 2.0000 | INHALATION_SPRAY | RESPIRATORY_TRACT | 5 refills | Status: DC | PRN

## 2018-02-01 NOTE — Progress Notes (Signed)
Subjective:    Patient ID: William Bowman, male    DOB: Nov 28, 1976, 42 y.o.   MRN: [id number]  Chief Complaint  Patient presents with  . Follow-up  . Medication Refill    Inhaler     HPI Patient was seen today for f/u.  Patient was last seen 01/01/18.  Next time he was noted to be tachycardic.  Labs were obtained including TSH, CBC, BMP which were normal.  Of note patient's vitamin D level was low at 9.98.  Pt has not started Vitamin D replacement or picked up his flonase.    Pt endorses nasal congestion, cough, facial pressure x 1.5 wks.  Pt states he has tried nyquil.  He denies fever, sore throat.  Pt also endorses pruritic skin rash.  Has been using aquaphor and cortisone.  In the past pt endorses seeing a dermatologist for Eczema and using Triamcinalone cream.   Pt thinks they may have changed detergents at work.  Patient is still smoking cigarettes.  He states he actually did not smoke any today.  Patient is also still using marijuana.  Past Medical History:  Diagnosis Date  . Asthma   . Pneumonia     No Known Allergies  ROS General: Denies fever, chills, night sweats, changes in weight, changes in appetite HEENT: Denies headaches, ear pain, changes in vision, rhinorrhea, sore throat   +nasal congestion, facial pressure CV: Denies CP, palpitations, SOB, orthopnea  +tachycardia Pulm: Denies SOB, wheezing  +cough GI: Denies abdominal pain, nausea, vomiting, diarrhea, constipation GU: Denies dysuria, hematuria, frequency, vaginal discharge Msk: Denies muscle cramps, joint pains Neuro: Denies weakness, numbness, tingling Skin: Denies bruising   +rash Psych: Denies depression, hallucinations  +anxiety     Objective:    Blood pressure 110/68, pulse (!) 108, temperature 99.2 F (37.3 C), temperature source Oral, height 5\' 6"  (1.676 m), weight 233 lb (105.7 kg), SpO2 97 %.   Gen. Pleasant, well-nourished, in no distress, normal affect   HEENT: [place]/AT, face symmetric, no  scleral icterus, PERRLA, nares patent without drainage, pharynx without erythema or exudate. Lungs: no accessory muscle use, CTAB, no wheezes or rales Cardiovascular: tachycardia, no m/r/g, no peripheral edema.  EKG obtained. Abdomen: BS present, soft, NT/ND Neuro:  A&Ox3, CN II-XII intact, normal gait Skin: Warm dry and intact.  Chest, neck, arms with eczematous changes-thickened hyperpigmented dry skin.  No erythema, no exudate, no increased warmth.  No lesions and web spaces of fingers.   Wt Readings from Last 3 Encounters:  02/01/18 233 lb (105.7 kg)  01/01/18 222 lb 3.2 oz (100.8 kg)  04/12/15 225 lb 4 oz (102.2 kg)    Lab Results  Component Value Date   WBC 4.2 01/30/2018   HGB 14.1 01/30/2018   HCT 41.5 01/30/2018   PLT 213.0 01/30/2018   GLUCOSE 88 01/30/2018   CHOL 205 (H) 01/30/2018   TRIG 94.0 01/30/2018   HDL 48.70 01/30/2018   LDLCALC 137 (H) 01/30/2018   ALT 27 01/30/2018   AST 18 01/30/2018   NA 140 01/30/2018   K 4.2 01/30/2018   CL 104 01/30/2018   CREATININE 0.86 01/30/2018   BUN 12 01/30/2018   CO2 30 01/30/2018   TSH 0.76 01/30/2018    Assessment/Plan:  Tachycardia -Labs done at last visit were negative including CBC, TSH, T4, CMP. -EKG obtained this visit.  Similar to prior study on 04/14/15.  Normal sinus rhythm.  Ventricular rate 80 -We will continue to monitor.  Possibly elevated secondary to anxiety  and cigarette use.  - Plan: EKG 12-Lead  Anxiety -Gad 7 score of 4 -Given handout on anxiety -Given handout on area counseling services  Cigarette nicotine dependence without complication -Smoking cessation counseling greater than 3 minutes, less than 10 minutes -Currently smoking less than 1/2 pack/day -Patient has had no cigarettes today.  Discussed cutting down. -Also discussed other options to help him quit including medications. -Patient would like to think about this -We will discuss at each visit  Acute non-recurrent maxillary  sinusitis -Also discussed using Flonase to help with symptoms - Plan: amoxicillin (AMOXIL) 500 MG capsule  Flexural eczema  - Plan: triamcinolone cream (KENALOG) 0.5 %  Mild intermittent asthma without complication  - Plan: albuterol (PROVENTIL HFA;VENTOLIN HFA) 108 (90 Base) MCG/ACT inhaler   Follow-up PRN  Grier Mitts, MD

## 2018-02-01 NOTE — Progress Notes (Signed)
Telephone call from on call service. meds not at [facility] on [address].   Looked up in chart - sent ot [facility].   Spoke to patient and sent meds to [facility].

## 2018-02-01 NOTE — Patient Instructions (Addendum)
Sinusitis, Adult Sinusitis is soreness and inflammation of your sinuses. Sinuses are hollow spaces in the bones around your face. Your sinuses are located:  Around your eyes.  In the middle of your forehead.  Behind your nose.  In your cheekbones.  Your sinuses and nasal passages are lined with a stringy fluid (mucus). Mucus normally drains out of your sinuses. When your nasal tissues become inflamed or swollen, the mucus can become trapped or blocked so air cannot flow through your sinuses. This allows bacteria, viruses, and funguses to grow, which leads to infection. Sinusitis can develop quickly and last for 7?10 days (acute) or for more than 12 weeks (chronic). Sinusitis often develops after a cold. What are the causes? This condition is caused by anything that creates swelling in the sinuses or stops mucus from draining, including:  Allergies.  Asthma.  Bacterial or viral infection.  Abnormally shaped bones between the nasal passages.  Nasal growths that contain mucus (nasal polyps).  Narrow sinus openings.  Pollutants, such as chemicals or irritants in the air.  A foreign object stuck in the nose.  A fungal infection. This is rare.  What increases the risk? The following factors may make you more likely to develop this condition:  Having allergies or asthma.  Having had a recent cold or respiratory tract infection.  Having structural deformities or blockages in your nose or sinuses.  Having a weak immune system.  Doing a lot of swimming or diving.  Overusing nasal sprays.  Smoking.  What are the signs or symptoms? The main symptoms of this condition are pain and a feeling of pressure around the affected sinuses. Other symptoms include:  Upper toothache.  Earache.  Headache.  Bad breath.  Decreased sense of smell and taste.  A cough that may get worse at night.  Fatigue.  Fever.  Thick drainage from your nose. The drainage is often green and  it may contain pus (purulent).  Stuffy nose or congestion.  Postnasal drip. This is when extra mucus collects in the throat or back of the nose.  Swelling and warmth over the affected sinuses.  Sore throat.  Sensitivity to light.  How is this diagnosed? This condition is diagnosed based on symptoms, a medical history, and a physical exam. To find out if your condition is acute or chronic, your health care provider may:  Look in your nose for signs of nasal polyps.  Tap over the affected sinus to check for signs of infection.  View the inside of your sinuses using an imaging device that has a light attached (endoscope).  If your health care provider suspects that you have chronic sinusitis, you may also:  Be tested for allergies.  Have a sample of mucus taken from your nose (nasal culture) and checked for bacteria.  Have a mucus sample examined to see if your sinusitis is related to an allergy.  If your sinusitis does not respond to treatment and it lasts longer than 8 weeks, you may have an MRI or CT scan to check your sinuses. These scans also help to determine how severe your infection is. In rare cases, a bone biopsy may be done to rule out more serious types of fungal sinus disease. How is this treated? Treatment for sinusitis depends on the cause and whether your condition is chronic or acute. If a virus is causing your sinusitis, your symptoms will go away on their own within 10 days. You may be given medicines to relieve your symptoms,   including:  Topical nasal decongestants. They shrink swollen nasal passages and let mucus drain from your sinuses.  Antihistamines. These drugs block inflammation that is triggered by allergies. This can help to ease swelling in your nose and sinuses.  Topical nasal corticosteroids. These are nasal sprays that ease inflammation and swelling in your nose and sinuses.  Nasal saline washes. These rinses can help to get rid of thick mucus in  your nose.  If your condition is caused by bacteria, you will be given an antibiotic medicine. If your condition is caused by a fungus, you will be given an antifungal medicine. Surgery may be needed to correct underlying conditions, such as narrow nasal passages. Surgery may also be needed to remove polyps. Follow these instructions at home: Medicines  Take, use, or apply over-the-counter and prescription medicines only as told by your health care provider. These may include nasal sprays.  If you were prescribed an antibiotic medicine, take it as told by your health care provider. Do not stop taking the antibiotic even if you start to feel better. Hydrate and Humidify  Drink enough water to keep your urine clear or pale yellow. Staying hydrated will help to thin your mucus.  Use a cool mist humidifier to keep the humidity level in your home above 50%.  Inhale steam for 10-15 minutes, 3-4 times a day or as told by your health care provider. You can do this in the bathroom while a hot shower is running.  Limit your exposure to cool or dry air. Rest  Rest as much as possible.  Sleep with your head raised (elevated).  Make sure to get enough sleep each night. General instructions  Apply a warm, moist washcloth to your face 3-4 times a day or as told by your health care provider. This will help with discomfort.  Wash your hands often with soap and water to reduce your exposure to viruses and other germs. If soap and water are not available, use hand sanitizer.  Do not smoke. Avoid being around people who are smoking (secondhand smoke).  Keep all follow-up visits as told by your health care provider. This is important. Contact a health care provider if:  You have a fever.  Your symptoms get worse.  Your symptoms do not improve within 10 days. Get help right away if:  You have a severe headache.  You have persistent vomiting.  You have pain or swelling around your face or  eyes.  You have vision problems.  You develop confusion.  Your neck is stiff.  You have trouble breathing. This information is not intended to replace advice given to you by your health care provider. Make sure you discuss any questions you have with your health care provider. Document Released: 12/11/2005 Document Revised: 08/06/2016 Document Reviewed: 10/06/2015 Elsevier Interactive Patient Education  2018 [employer].  Smoking Tobacco Information Smoking tobacco will very likely harm your health. Tobacco contains a poisonous (toxic), colorless chemical called nicotine. Nicotine affects the brain and makes tobacco addictive. This change in your brain can make it hard to stop smoking. Tobacco also has other toxic chemicals that can hurt your body and raise your risk of many cancers. How can smoking tobacco affect me? Smoking tobacco can increase your chances of having serious health conditions, such as:  Cancer. Smoking is most commonly associated with lung cancer, but can lead to cancer in other parts of the body.  Chronic obstructive pulmonary disease (COPD). This is a long-term lung condition that  makes it hard to breathe. It also gets worse over time.  High blood pressure (hypertension), heart disease, stroke, or heart attack.  Lung infections, such as pneumonia.  Cataracts. This is when the lenses in the eyes become clouded.  Digestive problems. This may include peptic ulcers, heartburn, and gastroesophageal reflux disease (GERD).  Oral health problems, such as gum disease and tooth loss.  Loss of taste and smell.  Smoking can affect your appearance by causing:  Wrinkles.  Yellow or stained teeth, fingers, and fingernails.  Smoking tobacco can also affect your social life.  Many workplaces, [employer], hotels, and public places are tobacco-free. This means that you may experience challenges in finding places to smoke when away from home.  The cost of a smoking  habit can be expensive. Expenses for someone who smokes come in two ways: ? You spend money on a regular basis to buy tobacco. ? Your health care costs in the long-term are higher if you smoke.  Tobacco smoke can also affect the health of those around you. Children of smokers have greater chances of: ? Sudden infant death syndrome (SIDS). ? Ear infections. ? Lung infections.  What lifestyle changes can be made?  Do not start smoking. Quit if you already do.  To quit smoking: ? Make a plan to quit smoking and commit yourself to it. Look for programs to help you and ask your health care provider for recommendations and ideas. ? Talk with your health care provider about using nicotine replacement medicines to help you quit. Medicine replacement medicines include gum, lozenges, patches, sprays, or pills. ? Do not replace cigarette smoking with electronic cigarettes, which are commonly called e-cigarettes. The safety of e-cigarettes is not known, and some may contain harmful chemicals. ? Avoid places, people, or situations that tempt you to smoke. ? If you try to quit but return to smoking, don't give up hope. It is very common for people to try a number of times before they fully succeed. When you feel ready again, give it another try.  Quitting smoking might affect the way you eat as well as your weight. Be prepared to monitor your eating habits. Get support in planning and following a healthy diet.  Ask your health care provider about having regular tests (screenings) to check for cancer. This may include blood tests, imaging tests, and other tests.  Exercise regularly. Consider taking walks, joining a gym, or doing yoga or exercise classes.  Develop skills to manage your stress. These skills include meditation. What are the benefits of quitting smoking? By quitting smoking, you may:  Lower your risk of getting cancer and other diseases caused by smoking.  Live longer.  Breathe  better.  Lower your blood pressure and heart rate.  Stop your addiction to tobacco.  Stop creating secondhand smoke that hurts other people.  Improve your sense of taste and smell.  Look better over time, due to having fewer wrinkles and less staining.  What can happen if changes are not made? If you do not stop smoking, you may:  Get cancer and other diseases.  Develop COPD or other long-term (chronic) lung conditions.  Develop serious problems with your heart and blood vessels (cardiovascular system).  Need more tests to screen for problems caused by smoking.  Have higher, long-term healthcare costs from medicines or treatments related to smoking.  Continue to have worsening changes in your lungs, mouth, and nose.  Where to find support: To get support to quit smoking, consider:  Asking your health care provider for more information and resources.  Taking classes to learn more about quitting smoking.  Looking for local organizations that offer resources about quitting smoking.  Joining a support group for people who want to quit smoking in your local community.  Where to find more information: You may find more information about quitting smoking from:  HelpGuide.org: www.helpguide.org/articles/addictions/how-to-quit-smoking.htm  https://hall.com/: smokefree.gov  American Lung Association: www.lung.org  Contact a health care provider if:  You have problems breathing.  Your lips, nose, or fingers turn blue.  You have chest pain.  You are coughing up blood.  You feel faint or you pass out.  You have other noticeable changes that cause you to worry. Summary  Smoking tobacco can negatively affect your health, the health of those around you, your finances, and your social life.  Do not start smoking. Quit if you already do. If you need help quitting, ask your health care provider.  Think about joining a support group for people who want to quit smoking in  your local community. There are many effective programs that will help you to quit this behavior. This information is not intended to replace advice given to you by your health care provider. Make sure you discuss any questions you have with your health care provider. Document Released: 12/26/2016 Document Revised: 12/26/2016 Document Reviewed: 12/26/2016 Elsevier Interactive Patient Education  2018 [employer].  Steps to Quit Smoking Smoking tobacco can be bad for your health. It can also affect almost every organ in your body. Smoking puts you and people around you at risk for many serious long-lasting (chronic) diseases. Quitting smoking is hard, but it is one of the best things that you can do for your health. It is never too late to quit. What are the benefits of quitting smoking? When you quit smoking, you lower your risk for getting serious diseases and conditions. They can include:  Lung cancer or lung disease.  Heart disease.  Stroke.  Heart attack.  Not being able to have children (infertility).  Weak bones (osteoporosis) and broken bones (fractures).  If you have coughing, wheezing, and shortness of breath, those symptoms may get better when you quit. You may also get sick less often. If you are pregnant, quitting smoking can help to lower your chances of having a baby of low birth weight. What can I do to help me quit smoking? Talk with your doctor about what can help you quit smoking. Some things you can do (strategies) include:  Quitting smoking totally, instead of slowly cutting back how much you smoke over a period of time.  Going to in-person counseling. You are more likely to quit if you go to many counseling sessions.  Using resources and support systems, such as: ? Database administrator with a Social worker. ? Phone quitlines. ? Careers information officer. ? Support groups or group counseling. ? Text messaging programs. ? Mobile phone apps or applications.  Taking  medicines. Some of these medicines may have nicotine in them. If you are pregnant or breastfeeding, do not take any medicines to quit smoking unless your doctor says it is okay. Talk with your doctor about counseling or other things that can help you.  Talk with your doctor about using more than one strategy at the same time, such as taking medicines while you are also going to in-person counseling. This can help make quitting easier. What things can I do to make it easier to quit? Quitting smoking might feel very  hard at first, but there is a lot that you can do to make it easier. Take these steps:  Talk to your family and friends. Ask them to support and encourage you.  Call phone quitlines, reach out to support groups, or work with a Social worker.  Ask people who smoke to not smoke around you.  Avoid places that make you want (trigger) to smoke, such as: ? Bars. ? Parties. ? Smoke-break areas at work.  Spend time with people who do not smoke.  Lower the stress in your life. Stress can make you want to smoke. Try these things to help your stress: ? Getting regular exercise. ? Deep-breathing exercises. ? Yoga. ? Meditating. ? Doing a body scan. To do this, close your eyes, focus on one area of your body at a time from head to toe, and notice which parts of your body are tense. Try to relax the muscles in those areas.  Download or buy apps on your mobile phone or tablet that can help you stick to your quit plan. There are many free apps, such as QuitGuide from the [employer] Office manager for Disease Control and Prevention). You can find more support from smokefree.gov and other websites.  This information is not intended to replace advice given to you by your health care provider. Make sure you discuss any questions you have with your health care provider. Document Released: 10/07/2009 Document Revised: 08/08/2016 Document Reviewed: 04/27/2015 Elsevier Interactive Patient Education  2018 [employer].  Eczema Eczema is a broad term for a group of skin conditions that cause skin to become rough and inflamed. Each type of eczema has different triggers, symptoms, and treatments. Eczema of any type is usually itchy and symptoms range from mild to severe. Eczema and its symptoms are not spread from person to person (are not contagious). It can appear on different parts of the body at different times. Your eczema may not look the same as someone else's eczema. What are the types of eczema? Atopic dermatitis This is a long-term (chronic) skin disease that keeps coming back (recurring). Usual symptoms are dry skin and small, solid pimples that may swell and leak fluid (weep). Contact dermatitis This happens when something irritates the skin and causes a rash. The irritation can come from substances that you are allergic to (allergens), such as poison ivy, chemicals, or medicines that were applied to your skin. Dyshidrotic eczema This is a form of eczema on the hands and feet. It shows up as very itchy, fluid-filled blisters. It can affect people of any age, but is more common before age 40. Hand eczema This causes very itchy areas of skin on the palms and sides of the hands and fingers. This type of eczema is common in industrial jobs where you may be exposed to many different types of irritants. Lichen simplex chronicus This type of eczema occurs when a person constantly scratches one area of the body. Repeated scratching of the area leads to thickened skin (lichenification). Lichen simplex chronicus can occur along with other types of eczema. It is more common in adults, but may be seen in children as well. Nummular eczema This is a common type of eczema. It has no known cause. It typically causes a red, circular, crusty lesion (plaque) that may be itchy. Scratching may become a habit and can cause bleeding. Nummular eczema occurs most often in people of middle-age or older. It most often affects  the hands. Seborrheic dermatitis This is a common skin  disease that mainly affects the scalp. It may also affect any oily areas of the body, such as the face, sides of nose, eyebrows, ears, eyelids, and chest. It is marked by small scaling and redness of the skin (erythema). This can affect people of all ages. In infants, this condition is known as Chartered certified accountant." Stasis dermatitis This is a common skin disease that usually appears on the legs and feet. It most often occurs in people who have a condition that prevents blood from being pumped through the veins in the legs (chronic venous insufficiency). Stasis dermatitis is a chronic condition that needs long-term management. How is eczema diagnosed? Your health care provider will examine your skin and review your medical history. He or she may also give you skin patch tests. These tests involve taking patches that contain possible allergens and placing them on your back. He or she will then check in a few days to see if an allergic reaction occurred. What are the common treatments? Treatment for eczema is based on the type of eczema you have. Hydrocortisone steroid medicine can relieve itching quickly and help reduce inflammation. This medicine may be prescribed or obtained over-the-counter, depending on the strength of the medicine that is needed. Follow these instructions at home:  Take over-the-counter and prescription medicines only as told by your health care provider.  Use creams or ointments to moisturize your skin. Do not use lotions.  Learn what triggers or irritates your symptoms. Avoid these things.  Treat symptom flare-ups quickly.  Do not itch your skin. This can make your rash worse.  Keep all follow-up visits as told by your health care provider. This is important. Where to find more information:  The American Academy of Dermatology: http://jones-macias.info/  The National Eczema Association: www.nationaleczema.org Contact a health care  provider if:  You have serious itching, even with treatment.  You regularly scratch your skin until it bleeds.  Your rash looks different than usual.  Your skin is painful, swollen, or more red than usual.  You have a fever. Summary  There are eight general types of eczema. Each type has different triggers.  Eczema of any type causes itching that may range from mild to severe.  Treatment varies based on the type of eczema you have. Hydrocortisone steroid medicine can help with itching and inflammation.  Protecting your skin is the best way to prevent eczema. Use moisturizers and lotions. Avoid triggers and irritants, and treat flare-ups quickly. This information is not intended to replace advice given to you by your health care provider. Make sure you discuss any questions you have with your health care provider. Document Released: 04/26/2017 Document Revised: 04/26/2017 Document Reviewed: 04/26/2017 Elsevier Interactive Patient Education  2018 [employer].  How to [place] the Smoking Habit   Why should I quit smoking?  Quitting smoking is the most important thing you can do for your health. Smoking can cause cancer, lung disease, heart disease, and many other health problems. Secondhand smoke can be dangerous too. It can cause lung cancer and heart disease in adults. It can make asthma worse or cause ear infections in kids.   You'll see benefits as soon as you quit smoking. Your heart rate and blood pressure will go down. You'll breathe easier. It will be easier to exercise. Your sense of smell and taste will be better. You'll lower your risk of cancer, lung disease, and heart disease. You'll even live longer!   Why is it so hard to quit smoking?  Nicotine is a strong drug. Your body becomes addicted to nicotine when you smoke. You may have withdrawal symptoms or cravings when you stop smoking. You may become anxious or irritable. You might have trouble sleeping or want to eat more.  These symptoms are usually worst the first week after quitting. The good news is nicotine withdrawal symptoms only last a few weeks for most people.  The routines and habits that go along with smoking can make it tough to quit too. Some people often smoke a cigarette when they drive, after a meal, or when they're on the phone. Smoking can become a part of these routines. After you quit smoking these habits can be a trigger to make you want to smoke again. It's important to separate smoking from these routines when you quit.  How can I make it easier to quit?  You don't have to quit "cold [place]." You can double or triple the chance that you'll stop smoking if you use a medicine and counseling together. There are many medicines available. These medicines work in different ways to help manage nicotine withdrawal. Many can be bought off the shelves at your local pharmacy. Some require a prescription. Talk to your pharmacist or prescriber about what medicines may be right for you.  It is very important to have counseling when you quit. Medicines can help you cope with nicotine withdrawal. Counseling can help you develop skills to break smoking habits. There are lots of counseling options available. Many of these are free. Some options are local support groups, telephone quitlines, online services, and texting programs.   Start thinking now about how you plan to quit. Think about why you want to quit. Look at triggers that make you want to smoke. Plan for challenges you might face when trying to quit. Talk to your pharmacist about how to get help.   Where can I learn more?  Toll-free Quitlines and Websites:  In the [place] 1-800-QUIT-NOW(1-(859)472-6761); http://smokefree.gov    These websites include online support, live chat, and text messaging programs.

## 2018-02-24 ENCOUNTER — Emergency Department (HOSPITAL_COMMUNITY)
Admission: EM | Admit: 2018-02-24 | Discharge: 2018-02-24 | Disposition: A | Discharge status: Home / Self Care | Payer: BLUE CROSS/BLUE SHIELD | Attending: Emergency Medicine | Admitting: Emergency Medicine

## 2018-02-24 ENCOUNTER — Encounter (HOSPITAL_COMMUNITY): Payer: Self-pay | Admitting: *Deleted

## 2018-02-24 ENCOUNTER — Other Ambulatory Visit: Payer: Self-pay

## 2018-02-24 DIAGNOSIS — H5789 Other specified disorders of eye and adnexa: Secondary | ICD-10-CM | POA: Diagnosis present

## 2018-02-24 DIAGNOSIS — J45909 Unspecified asthma, uncomplicated: Secondary | ICD-10-CM | POA: Diagnosis not present

## 2018-02-24 DIAGNOSIS — H1032 Unspecified acute conjunctivitis, left eye: Secondary | ICD-10-CM | POA: Insufficient documentation

## 2018-02-24 MED ORDER — NAPROXEN 500 MG PO TABS: 500.0000 mg | ORAL_TABLET | Freq: Two times a day (BID) | ORAL | 0 refills | Status: DC

## 2018-02-24 MED ORDER — TETRACAINE HCL 0.5 % OP SOLN
2.0000 [drp] | Freq: Once | OPHTHALMIC | Status: AC
  Administered 2018-02-24: 2 [drp] via OPHTHALMIC
  Filled 2018-02-24: qty 4

## 2018-02-24 MED ORDER — CLINDAMYCIN HCL 150 MG PO CAPS: 450.0000 mg | ORAL_CAPSULE | Freq: Three times a day (TID) | ORAL | 0 refills | Status: DC

## 2018-02-24 MED ORDER — FLUORESCEIN SODIUM 1 MG OP STRP
1.0000 | ORAL_STRIP | Freq: Once | OPHTHALMIC | Status: AC
  Administered 2018-02-24: 1 via OPHTHALMIC
  Filled 2018-02-24: qty 1

## 2018-02-24 MED ORDER — ERYTHROMYCIN 5 MG/GM OP OINT: TOPICAL_OINTMENT | OPHTHALMIC | 0 refills | Status: DC

## 2018-02-24 NOTE — ED Provider Notes (Signed)
[facility] DEPARTMENT Provider Note   CSN: [id number] Arrival date & time: 02/24/18  1053     History   Chief Complaint Chief Complaint  Patient presents with  . Eye Problem    HPI William Bowman is a 42 y.o. male with a hx of asthma who presents to the ED with complaints of L eye sxs x 1 week. Patient states that sxs started with discharge described as mucous/watery and pruritus. States he then noted some erythema and subsequently developed pain. Rates pain a 4/10 in severity, worse with light and attempting to read, no alleviating factors. Has not tried any at home interventions. States he has also had some blurry vision in that eye in the last 24-36 hours.  Denies fever, chills, nausea, vomiting, headaches, congestion, rhinorrhea, or ear pain.   Patient is not a contact lens wearer. Does not recall foreign body or injury.  Last tetanus was 2015.   HPI  Past Medical History:  Diagnosis Date  . Asthma   . Pneumonia     There are no active problems to display for this patient.   History reviewed. No pertinent surgical history.     Home Medications    Prior to Admission medications   Medication Sig Start Date End Date Taking? Authorizing Provider  albuterol (PROVENTIL HFA;VENTOLIN HFA) 108 (90 Base) MCG/ACT inhaler Inhale 2 puffs into the lungs every 4 (four) hours as needed for wheezing or shortness of breath (cough). 02/01/18   Leamon Arnt, MD  cetirizine (ZYRTEC) 10 MG tablet Take 10 mg by mouth daily as needed for allergies.    [provider]  fluticasone (FLONASE) 50 MCG/ACT nasal spray Place 1 spray into both nostrils daily. 01/01/18   Billie Ruddy, MD  triamcinolone cream (KENALOG) 0.5 % Apply 1 application topically 2 (two) times daily. 02/01/18   Leamon Arnt, MD  Vitamin D, Ergocalciferol, (DRISDOL) 50000 units CAPS capsule Take 1 capsule (50,000 Units total) by mouth every 7 (seven) days. 02/01/18   Leamon Arnt, MD      Family History History reviewed. No pertinent family history.  Social History Social History   Tobacco Use  . Smoking status: Never Smoker  . Smokeless tobacco: Never Used  Substance Use Topics  . Alcohol use: Yes    Comment: ocassionally beer and/or liquor  . Drug use: Yes    Frequency: 2.0 times per week    Types: Marijuana     Allergies   Patient has no known allergies.   Review of Systems Review of Systems  Constitutional: Negative for chills and fever.  HENT: Negative for congestion, ear pain and sore throat.   Eyes: Positive for photophobia, pain, discharge, redness, itching and visual disturbance.  Respiratory: Negative for cough.   Neurological: Negative for headaches.   Physical Exam Updated Vital Signs BP 136/88 (BP Location: Right Arm)   Pulse 76   Temp 98.7 F (37.1 C) (Oral)   Resp (!) 22   Ht 5\' 7"  (1.702 m)   Wt 100.7 kg (222 lb)   SpO2 98%   BMI 34.77 kg/m   Physical Exam  Constitutional: He appears well-developed and well-nourished.  Non-toxic appearance. No distress.  HENT:  Head: Normocephalic and atraumatic.  Right Ear: Tympanic membrane normal. Tympanic membrane is not perforated, not erythematous, not retracted and not bulging.  Left Ear: Tympanic membrane normal. Tympanic membrane is not perforated, not erythematous, not retracted and not bulging.  Nose: Mucosal edema present.  Right sinus exhibits no maxillary sinus tenderness and no frontal sinus tenderness. Left sinus exhibits no maxillary sinus tenderness and no frontal sinus tenderness.  Mouth/Throat: Uvula is midline and oropharynx is clear and moist. No oropharyngeal exudate or posterior oropharyngeal erythema.  Eyes: EOM are normal. Pupils are equal, round, and reactive to light. Lids are everted and swept, no foreign bodies found. Right eye exhibits no discharge. No foreign body present in the right eye. Left eye exhibits discharge (minimal amount). No foreign body present in the  left eye. Right conjunctiva is not injected. Right conjunctiva has no hemorrhage. Left conjunctiva is injected. Left conjunctiva has no hemorrhage.  There is mild erythema and swelling to the upper left lid, otherwise no periorbital edema.  No proptosis. Woods lamp exam left eye: No fluorescein uptake-no indication of corneal ulcer/abrasion.  No dendritic staining.  No evidence of hyphema or hypopyon L eye IOP: 18  Neurological: He is alert.  Clear speech.   Psychiatric: He has a normal mood and affect. His behavior is normal. Thought content normal.  Nursing note and vitals reviewed.   ED Treatments / Results  Labs (all labs ordered are listed, but only abnormal results are displayed) Labs Reviewed - No data to display  EKG  EKG Interpretation None       Radiology No results found.  Procedures Procedures (including critical care time)  Medications Ordered in ED Medications  fluorescein ophthalmic strip 1 strip (1 strip Left Eye Given 02/24/18 1243)  tetracaine (PONTOCAINE) 0.5 % ophthalmic solution 2 drop (2 drops Left Eye Given 02/24/18 1243)   Initial Impression / Assessment and Plan / ED Course  I have reviewed the triage vital signs and the nursing notes.  Pertinent labs & imaging results that were available during my care of the patient were reviewed by me and considered in my medical decision making (see chart for details).    Patient presents with multiple complaints of the L eye. He is nontoxic appearing, in no apparent distress, vitals without significant abnormality.  Patient is not a contact lens wearer.  No significant visual acuity deficit. There is no fluorescein uptake on exam, no indication of corneal abrasion/ulceration or HSV. IOP WNL, doubt acute glaucoma.  Patient is afebrile and without proptosis, entrapment, or consensual photophobia- doubt orbital cellulitis. There is some mild swelling and erythema of the lid, otherwise no periorbital edema, will treat for  possible early periorbital cellulitis with with low suspicion, prescription for clindamycin given. Patient with conjunctival injection with minimal discharge will treat for bacterial conjunctivitis with erythromycin ophthalmic ointment.  I discussed treatment plan, need for ophthalmology follow-up, and return precautions with the patient. Provided opportunity for questions, patient confirmed understanding and is in agreement with plan.   Final Clinical Impressions(s) / ED Diagnoses   Final diagnoses:  Acute conjunctivitis of left eye, unspecified acute conjunctivitis type    ED Discharge Orders        Ordered    erythromycin ophthalmic ointment     02/24/18 1344    clindamycin (CLEOCIN) 150 MG capsule  3 times daily     02/24/18 1344    naproxen (NAPROSYN) 500 MG tablet  2 times daily     02/24/18 [address] [employer] R, PA-C 02/24/18 1724    Virgel Manifold, MD 02/25/18 [phone]

## 2018-02-24 NOTE — ED Notes (Signed)
Declined W/C at D/C and was escorted to lobby by RN. 

## 2018-02-24 NOTE — ED Triage Notes (Signed)
Pt reports left eye pain, drainage and irritation x 1 week with vision changes. No acute distress is noted at triage.

## 2018-02-24 NOTE — Discharge Instructions (Signed)
You were seen in the emergency department today and diagnosed with conjunctivitis, this is an infection of your eye- we are treating this with a topical antibiotic.  Given the pain that you are having we are also treating you for potential early periorbital cellulitis with an oral antibiotic.   - Erythromycin ointment-apply half inch ribbon of ointment to the lower lid of your eye 4-6 times per day for the next 7 days. -Clindamycin-take 3 pills every 8 hours for the next 7 days.  Please take all of your antibiotics until finished. You may develop abdominal discomfort or diarrhea from the antibiotic.  You may help offset this with probiotics which you can buy at the store (ask your pharmacist if unable to find) or get probiotics in the form of eating yogurt. Do not eat or take the probiotics until 2 hours after your antibiotic. If you are unable to tolerate these side effects follow-up with your primary care provider or return to the emergency department.   If you begin to experience any blistering, rashes, swelling, or difficulty breathing seek medical care for evaluation of potentially more serious side effects.   - Naproxen- Naproxen is a nonsteroidal anti-inflammatory medication that will help with pain and swelling. Be sure to take this medication as prescribed with food, 1 pill every 12 hours,  It should be taken with food, as it can cause stomach upset, and more seriously, stomach bleeding. Do not take other nonsteroidal anti-inflammatory medications with this such as Advil, Motrin, or Aleve.    Please be aware that this medication may interact with other medications you are taking, please be sure to discuss your medication list with your pharmacist.    Follow-up with an ophthalmologist, Dr. Coralyn Pear provided in your discharge instructions, within the next 3 days if you have not had improvement of your symptoms.  Return to the emergency department for new or worsening symptoms including but not  limited to worsening pain, change in your vision, fever, or any other concerns that you have.

## 2018-03-13 ENCOUNTER — Telehealth: Payer: Self-pay | Admitting: Family Medicine

## 2018-03-13 NOTE — Telephone Encounter (Signed)
Patient wants to know if they can have ointment instead of cream in the Triamcinolone 0.5 mg

## 2018-03-13 NOTE — Telephone Encounter (Signed)
Copied from [place] [id number]. Topic: Quick Communication - Rx Refill/Question >> Mar 13, 2018  1:55 PM Lolita Rieger, [place] wrote: Medication:triacinolone 0.5 mg   Has the patient contacted their pharmacy? yes   (Agent: If no, request that the patient contact the pharmacy for the refill.)   Preferred Pharmacy (with phone number or street name): [facility] [address]    Pt would like cream changed to ointment   Agent: Please be advised that RX refills may take up to 3 business days. We ask that you follow-up with your pharmacy.

## 2018-03-13 NOTE — Telephone Encounter (Signed)
That's fine

## 2018-03-13 NOTE — Telephone Encounter (Signed)
Please advise 

## 2018-03-15 ENCOUNTER — Other Ambulatory Visit: Payer: Self-pay | Admitting: Family Medicine

## 2018-03-15 DIAGNOSIS — L2082 Flexural eczema: Secondary | ICD-10-CM

## 2018-03-15 MED ORDER — TRIAMCINOLONE ACETONIDE 0.5 % EX CREA: 1.0000 "application " | TOPICAL_CREAM | Freq: Two times a day (BID) | CUTANEOUS | 1 refills | Status: DC

## 2018-03-15 NOTE — Telephone Encounter (Signed)
Medication was escribed to the pharmacy.  

## 2018-03-27 ENCOUNTER — Telehealth: Payer: Self-pay | Admitting: Family Medicine

## 2018-03-27 NOTE — Telephone Encounter (Unsigned)
Copied from [place]. Topic: Quick Communication - Rx Refill/Question >> Mar 27, 2018 11:54 AM Neva Seat wrote: Pt is currently taking the  triamcinolone cream (KENALOG) 0.5 %, however he is requesting it be upgraded to the large tube of ointment  1 %. He states the cream doesn't work and is messy.  Please call into [facility] on [address] in [place]. Please call pt back if this cannot be done to discuss.

## 2018-03-27 NOTE — Telephone Encounter (Signed)
[facility] with Rx change? Please advise.

## 2018-03-27 NOTE — Telephone Encounter (Signed)
Pt. requesting to have the 0.5% Triamcinolone cream changed to 1% Triamcinolone Ointment.

## 2018-03-28 ENCOUNTER — Other Ambulatory Visit: Payer: Self-pay | Admitting: Family Medicine

## 2018-03-28 DIAGNOSIS — L2082 Flexural eczema: Secondary | ICD-10-CM

## 2018-03-28 MED ORDER — TRIAMCINOLONE ACETONIDE 0.5 % EX CREA: 1.0000 "application " | TOPICAL_CREAM | Freq: Two times a day (BID) | CUTANEOUS | 1 refills | Status: DC

## 2018-03-28 NOTE — Telephone Encounter (Signed)
Called pt and made him aware that the medication comes in 0.01%, 0.25% and, 0.5%  Pt requested a refill.

## 2018-03-28 NOTE — Telephone Encounter (Signed)
That's fine.  Please send in rx for the kenalog ointment for pt.

## 2018-04-03 ENCOUNTER — Ambulatory Visit: Payer: BLUE CROSS/BLUE SHIELD | Admitting: Family Medicine

## 2018-04-10 ENCOUNTER — Other Ambulatory Visit: Payer: Self-pay | Admitting: Family Medicine

## 2018-04-10 DIAGNOSIS — L2082 Flexural eczema: Secondary | ICD-10-CM

## 2018-04-10 MED ORDER — TRIAMCINOLONE ACETONIDE 0.1 % EX CREA: 1.0000 "application " | TOPICAL_CREAM | Freq: Two times a day (BID) | CUTANEOUS | 6 refills | Status: DC

## 2018-04-14 NOTE — Progress Notes (Signed)
[facility] [address], [place] [address] Phone: [phone] Subjective:     CC: Right shoulder pain  KGY:[id number]  William Bowman is a 42 y.o. male coming in with complaint of right shoulder pain. Patient hurt his arm in 1997. He has had lingering pain since then. In 2001 he also had another injury where he twisted and had similar shoulder pain that radiated into his neck. Patient is having pain into right arm and into right glute. Patient has tried to stretch quad which he feels is tight. Patient has seen chiropractor. Patient feels that he is having a decrease in his range of motion. Pain is constant and can be dull and sharp at times. Patient works as a Biomedical scientist.  Pain everywhere. States he is concern he is getting worsening overall.      Past Medical History:  Diagnosis Date  . Asthma   . Pneumonia    No past surgical history on file. Social History   Socioeconomic History  . Marital status: Single    Spouse name: Not on file  . Number of children: Not on file  . Years of education: Not on file  . Highest education level: Not on file  Occupational History  . Not on file  Social Needs  . Financial resource strain: Not on file  . Food insecurity:    Worry: Not on file    Inability: Not on file  . Transportation needs:    Medical: Not on file    Non-medical: Not on file  Tobacco Use  . Smoking status: Never Smoker  . Smokeless tobacco: Never Used  Substance and Sexual Activity  . Alcohol use: Yes    Comment: ocassionally beer and/or liquor  . Drug use: Yes    Frequency: 2.0 times per week    Types: Marijuana  . Sexual activity: Not on file  Lifestyle  . Physical activity:    Days per week: Not on file    Minutes per session: Not on file  . Stress: Not on file  Relationships  . Social connections:    Talks on phone: Not on file    Gets together: Not on file    Attends religious service: Not on file    Active member of  club or organization: Not on file    Attends meetings of clubs or organizations: Not on file    Relationship status: Not on file  Other Topics Concern  . Not on file  Social History Narrative  . Not on file   No Known Allergies No family history on file.  No family history of autoimmune patient was adopted   Past medical history, social, surgical and family history all reviewed in electronic medical record.  No pertanent information unless stated regarding to the chief complaint.   Review of Systems:Review of systems updated and as accurate as of 04/15/18  No headache, visual changes, nausea, vomiting, diarrhea, constipation, dizziness, abdominal pain, skin rash, fevers, chills, night sweats, weight loss, swollen lymph nodes,  joint swelling, chest pain, shortness of breath, mood changes.  Positive muscle aches, body aches  Objective  Blood pressure 110/78, pulse 93, height 5\' 7"  (1.702 m), weight 232 lb (105.2 kg), SpO2 94 %. Systems examined below as of 04/15/18   General: No apparent distress alert and oriented x3 mood and affect normal, dressed appropriately.  HEENT: Pupils equal, extraocular movements intact  Respiratory: Patient's speak in full sentences and does not appear short  of breath  Cardiovascular: No lower extremity edema, non tender, no erythema  Skin: Patient does have some darkening of the skin across the chest going down the anterior aspect of the arm.  Patient also has some discoloration and hyperpigmentation of the back as well. Abdomen: Soft nontender  Neuro: Cranial nerves II through XII are intact, neurovascularly intact in all extremities with 2+ DTRs and 2+ pulses.  Lymph: No lymphadenopathy of posterior or anterior cervical chain or axillae bilaterally.  Gait normal with good balance and coordination.  MSK:  Non tender with full range of motion and good stability and symmetric strength and tone of  elbows, wrist, hip, knee and ankles bilaterally.  Shoulder:  Right Inspection reveals no abnormalities, atrophy or asymmetry. Palpation is normal with no tenderness over AC joint or bicipital groove. ROM is full in all planes passively. Rotator cuff strength normal throughout. signs of impingement with positive Neer and Hawkin's tests, but negative empty can sign. Speeds and Yergason's tests normal. No labral pathology noted with negative Obrien's, negative clunk and good stability. Normal scapular function observed. No painful arc and no drop arm sign. No apprehension sign Neck exam shows the patient does have some mild loss of lordosis.  Some increasing discomfort with extension of the neck.  Negative Spurling's.  Patient does have full strength of the upper extremities.  MSK US performed of: Right This study was ordered, performed, and interpreted by Charlann Boxer D.O.  Shoulder:   Supraspinatus:  Appears normal on long and transverse views, Bursal bulge seen with shoulder abduction on impingement view. Infraspinatus:  Appears normal on long and transverse views. Significant increase in Doppler flow Subscapularis:  Appears normal on long and transverse views. Positive bursa Teres Minor:  Appears normal on long and transverse views. AC joint: Patient does have some arthritic changes and capsular distention noted Glenohumeral Joint:  Appears normal without effusion. Glenoid Labrum:  Intact without visualized tears. Biceps Tendon:  Appears normal on long and transverse views, no fraying of tendon, tendon located in intertubercular groove, no subluxation with shoulder internal or external rotation.  Impression: Minimal subacromial bursitis and acromioclavicular arthritis  [address]; 15 additional minutes spent for Therapeutic exercises as stated in above notes.  This included exercises focusing on stretching, strengthening, with significant focus on eccentric aspects.   Long term goals include an improvement in range of motion, strength, endurance as well  as avoiding reinjury. Patient's frequency would include in 1-2 times a day, 3-5 times a week for a duration of 6-12 weeks. Shoulder Exercises that included:  Basic scapular stabilization to include adduction and depression of scapula Scaption, focusing on proper movement and good control Internal and External rotation utilizing a theraband, with elbow tucked at side entire time Rows with theraband which was given   Proper technique shown and discussed handout in great detail with ATC.  All questions were discussed and answered.      Impression and Recommendations:     This case required medical decision making of moderate complexity.      Note: This dictation was prepared with Dragon dictation along with smaller phrase technology. Any transcriptional errors that result from this process are unintentional.

## 2018-04-15 ENCOUNTER — Ambulatory Visit (INDEPENDENT_AMBULATORY_CARE_PROVIDER_SITE_OTHER): Payer: BLUE CROSS/BLUE SHIELD | Admitting: Family Medicine

## 2018-04-15 ENCOUNTER — Other Ambulatory Visit (INDEPENDENT_AMBULATORY_CARE_PROVIDER_SITE_OTHER): Payer: BLUE CROSS/BLUE SHIELD

## 2018-04-15 ENCOUNTER — Encounter: Payer: Self-pay | Admitting: Family Medicine

## 2018-04-15 ENCOUNTER — Ambulatory Visit (INDEPENDENT_AMBULATORY_CARE_PROVIDER_SITE_OTHER)
Admission: RE | Admit: 2018-04-15 | Discharge: 2018-04-15 | Disposition: A | Discharge status: Home / Self Care | Payer: BLUE CROSS/BLUE SHIELD | Source: Ambulatory Visit | Attending: Family Medicine | Admitting: Family Medicine

## 2018-04-15 ENCOUNTER — Ambulatory Visit: Payer: Self-pay

## 2018-04-15 VITALS — BP 110/78 | HR 93 | Ht 67.0 in | Wt 232.0 lb

## 2018-04-15 DIAGNOSIS — G8929 Other chronic pain: Secondary | ICD-10-CM

## 2018-04-15 DIAGNOSIS — M19019 Primary osteoarthritis, unspecified shoulder: Secondary | ICD-10-CM | POA: Insufficient documentation

## 2018-04-15 DIAGNOSIS — M542 Cervicalgia: Secondary | ICD-10-CM

## 2018-04-15 DIAGNOSIS — M19011 Primary osteoarthritis, right shoulder: Secondary | ICD-10-CM

## 2018-04-15 DIAGNOSIS — M545 Low back pain, unspecified: Secondary | ICD-10-CM

## 2018-04-15 DIAGNOSIS — M25511 Pain in right shoulder: Secondary | ICD-10-CM

## 2018-04-15 DIAGNOSIS — M2550 Pain in unspecified joint: Secondary | ICD-10-CM | POA: Insufficient documentation

## 2018-04-15 DIAGNOSIS — M5441 Lumbago with sciatica, right side: Secondary | ICD-10-CM | POA: Insufficient documentation

## 2018-04-15 DIAGNOSIS — M47812 Spondylosis without myelopathy or radiculopathy, cervical region: Secondary | ICD-10-CM | POA: Diagnosis not present

## 2018-04-15 LAB — URIC ACID: URIC ACID, SERUM: 6.6 mg/dL (ref 4.0–7.8)

## 2018-04-15 LAB — IBC PANEL
Iron: 88 ug/dL (ref 42–165)
Saturation Ratios: 24.8 % (ref 20.0–50.0)
Transferrin: 253 mg/dL (ref 212.0–360.0)

## 2018-04-15 LAB — SEDIMENTATION RATE: SED RATE: 11 mm/h (ref 0–15)

## 2018-04-15 MED ORDER — GABAPENTIN 100 MG PO CAPS: 200.0000 mg | ORAL_CAPSULE | Freq: Every day | ORAL | 3 refills | Status: DC

## 2018-04-15 NOTE — Assessment & Plan Note (Signed)
Patient is having significant polyarthralgia.  He does have pain in the neck, shoulder, as well as even the lower back.  Does give history of some radicular symptoms.  Patient does have abnormality of the skin noted as well.  We will rule out autoimmune disease with laboratory workup.  Discussed with patient about over-the-counter medications and given gabapentin for some of the radicular symptoms associated with the back.  We discussed icing regimen and home exercises.  Discussed which activities doing which wants to avoid.  Patient will follow up with me again 4 weeks

## 2018-04-15 NOTE — Patient Instructions (Addendum)
Good to see you  Ice 20 minutes 2 times daily. Usually after activity and before bed. Keep hands within peripheral vison  Xrays and labs downstairs today  Gabapentin 200mg  to calm nerves down. Take this at night pennsaid pinkie amount topically 2 times daily as needed.  This was a tria size  Continue the once weekly vitamin D  Over the counter get  Turmeric 500mg  daily  Tart cherry extract any dose at night See me again in 3-4 weeks and discuss next step

## 2018-04-15 NOTE — Assessment & Plan Note (Signed)
Arthritis noted of the right shoulder.  Only of the acromioclavicular joint.  Worsening symptoms we will consider injection.  Home exercises given today.  Discussed icing regimen.  Discussed which activities to do which wants to avoid.  Patient will follow up with me again 4 weeks and if worsening pain consider the injection.

## 2018-04-15 NOTE — Assessment & Plan Note (Signed)
Patient gives history of sciatica.  Poor core strength.  X-rays pending.  Started gabapentin.  Home exercises given.  Encourage weight loss.  Patient has many polyarthralgia and will make sure nothing else is contributing.  Follow-up again in 4 weeks

## 2018-04-17 LAB — HIV ANTIBODY (ROUTINE TESTING W REFLEX): HIV 1&2 Ab, 4th Generation: NONREACTIVE

## 2018-04-17 LAB — ANTI-NUCLEAR AB-TITER (ANA TITER)

## 2018-04-17 LAB — CYCLIC CITRUL PEPTIDE ANTIBODY, IGG: Cyclic Citrullin Peptide Ab: <16 UNITS

## 2018-04-17 LAB — ANA: Anti Nuclear Antibody(ANA): POSITIVE — AB

## 2018-04-17 LAB — RHEUMATOID FACTOR

## 2018-04-17 LAB — ANGIOTENSIN CONVERTING ENZYME: ANGIOTENSIN-CONVERTING ENZYME: 30 U/L (ref 9–67)

## 2018-05-17 ENCOUNTER — Ambulatory Visit: Payer: BLUE CROSS/BLUE SHIELD | Admitting: Family Medicine

## 2018-06-05 ENCOUNTER — Ambulatory Visit: Payer: Self-pay

## 2018-06-05 ENCOUNTER — Ambulatory Visit (INDEPENDENT_AMBULATORY_CARE_PROVIDER_SITE_OTHER): Payer: BLUE CROSS/BLUE SHIELD | Admitting: Family Medicine

## 2018-06-05 ENCOUNTER — Encounter: Payer: Self-pay | Admitting: Family Medicine

## 2018-06-05 VITALS — BP 104/68 | HR 82 | Ht 67.0 in | Wt 230.0 lb

## 2018-06-05 DIAGNOSIS — M5416 Radiculopathy, lumbar region: Secondary | ICD-10-CM | POA: Diagnosis not present

## 2018-06-05 DIAGNOSIS — M7551 Bursitis of right shoulder: Secondary | ICD-10-CM | POA: Insufficient documentation

## 2018-06-05 DIAGNOSIS — M25511 Pain in right shoulder: Secondary | ICD-10-CM

## 2018-06-05 DIAGNOSIS — G8929 Other chronic pain: Secondary | ICD-10-CM

## 2018-06-05 DIAGNOSIS — M5441 Lumbago with sciatica, right side: Secondary | ICD-10-CM

## 2018-06-05 DIAGNOSIS — M19011 Primary osteoarthritis, right shoulder: Secondary | ICD-10-CM | POA: Diagnosis not present

## 2018-06-05 NOTE — Assessment & Plan Note (Signed)
Patient given injection.  Tolerated the procedure well.  Discussed icing regimen and home exercises.  Discussed avoiding certain activities.  Patient will do more of an icing regimen on a regular basis.  We discussed with activities of doing which wants to avoid.  Discussed posture and ergonomics.  Follow-up again 4 weeks

## 2018-06-05 NOTE — Assessment & Plan Note (Signed)
Patient did have bursitis.  Given injection today as well.  We discussed that doing this in the acromial clavicular will be taken difficult to do which would help.  Patient is to increase activity.  Continue home exercises.  Declined formal physical therapy.

## 2018-06-05 NOTE — Progress Notes (Signed)
[name] [address] [place], [place] [address] Phone: [phone] Subjective:      CC: Shoulder pain follow-up  BJY:[id number]  William Bowman is a 42 y.o. male coming in with complaint of shoulder pain. He said that the shoulder is still the same as last visit. Patient feels that it is hard to hold onto things in a relaxed state due to his shoulder pain. Patient has weakness in the right hand.  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  Conservative therapy including home exercise and icing regimen has not been beneficial.  He also is having lower back pain that radiates down into the right leg. He was unable to push off the right leg at work the other day. Denies any radiating symptoms.  Patient states some that the numbness in the leg seems to be more chronic at the moment.  Seems to sometimes have a weakness.  Having difficulty going up and down stairs.  Patient states that the pain is also becoming significantly more chronic.  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    Past Medical History:  Diagnosis Date  . Asthma   . Pneumonia    No past surgical history on file. Social History   Socioeconomic History  . Marital status: Single    Spouse name: Not on file  . Number of children: Not on file  . Years of education: Not on file  . Highest education level: Not on file  Occupational History  . Not on file  Social Needs  . Financial resource strain: Not on file  . Food insecurity:    Worry: Not on file    Inability: Not on file  . Transportation needs:    Medical: Not on file    Non-medical: Not on file  Tobacco Use  . Smoking status: Never Smoker  . Smokeless tobacco: Never Used  Substance and Sexual Activity  . Alcohol use: Yes    Comment: ocassionally beer and/or liquor    . Drug use: Yes    Frequency: 2.0 times per week    Types: Marijuana  . Sexual activity: Not on file  Lifestyle  . Physical activity:    Days per week: Not on file    Minutes per session: Not on file  . Stress: Not on file  Relationships  . Social connections:    Talks on phone: Not on file    Gets together: Not on file    Attends religious service: Not on file    Active member of club or organization: Not on file    Attends meetings of clubs or organizations: Not on file    Relationship status: Not on file  Other Topics Concern  . Not on file  Social History Narrative  . Not on file   No Known Allergies No family history on file.   Past medical history, social, surgical and family history all reviewed in electronic medical record.  No pertanent information unless stated regarding to the chief complaint.   Review of Systems:Review of systems updated and as accurate as of 06/05/18  No headache, visual changes, nausea, vomiting, diarrhea, constipation, dizziness, abdominal pain, skin rash, fevers, chills, night sweats, weight loss, swollen lymph nodes, body aches, joint swelling, muscle aches, chest pain, shortness of breath, mood changes.   Objective  There were no  vitals taken for this visit. Systems examined below as of 06/05/18   General: No apparent distress alert and oriented x3 mood and affect normal, dressed appropriately.  HEENT: Pupils equal, extraocular movements intact  Respiratory: Patient's speak in full sentences and does not appear short of breath  Cardiovascular: No lower extremity edema, non tender, no erythema  Skin: Warm dry intact with no signs of infection or rash on extremities or on axial skeleton.  Abdomen: Soft nontender  Neuro: Cranial nerves II through XII are intact, neurovascularly intact in all extremities withand 2+ pulses.  Lymph: No lymphadenopathy of posterior or anterior cervical chain or axillae bilaterally.  Gait normal with good  balance and coordination.  MSK:  Non tender with full range of motion and good stability and symmetric strength and tone of , elbows, wrist, hip, knee and ankles bilaterally.  Shoulder: Right Inspection reveals no abnormalities, atrophy or asymmetry. Palpation is normal with no tenderness over AC joint or bicipital groove. ROM is full in all planes passively. Rotator cuff strength normal throughout. signs of impingement with positive Neer and Hawkin's tests, but negative empty can sign. Speeds and Yergason's tests normal. No labral pathology noted with negative Obrien's, negative clunk and good stability.  Positive crossover Normal scapular function observed. No painful arc and no drop arm sign. No apprehension sign Contralateral shoulder unremarkable  Back exam shows loss of lordosis.  Patient does have a positive straight leg test on the right side and 20 degrees.  Patient does have some weakness with plantar flexion with 4 out of 5 strength compared to the contralateral side.  1+ deep tendon reflex on the right.   Procedure: Real-time Ultrasound Guided Injection of right glenohumeral joint Device: GE Logiq E  Ultrasound guided injection is preferred based studies that show increased duration, increased effect, greater accuracy, decreased procedural pain, increased response rate with ultrasound guided versus blind injection.  Verbal informed consent obtained.  Time-out conducted.  Noted no overlying erythema, induration, or other signs of local infection.  Skin prepped in a sterile fashion.  Local anesthesia: Topical Ethyl chloride.  With sterile technique and under real time ultrasound guidance:  Joint visualized.  23g 1  inch needle inserted posterior approach. Pictures taken for needle placement. Patient did have injection of 2 cc of 1% lidocaine, 2 cc of 0.5% Marcaine, and 1.0 cc of Kenalog 40 mg/dL. Completed without difficulty  Pain immediately resolved suggesting accurate  placement of the medication.  Advised to call if fevers/chills, erythema, induration, drainage, or persistent bleeding.  Images permanently stored and available for review in the ultrasound unit.  Impression: Technically successful ultrasound guided injection.  Procedure: Real-time Ultrasound Guided Injection of right acromioclavicular joint Device: GE Logiq Q7 Ultrasound guided injection is preferred based studies that show increased duration, increased effect, greater accuracy, decreased procedural pain, increased response rate, and decreased cost with ultrasound guided versus blind injection.  Verbal informed consent obtained.  Time-out conducted.  Noted no overlying erythema, induration, or other signs of local infection.  Skin prepped in a sterile fashion.  Local anesthesia: Topical Ethyl chloride.  With sterile technique and under real time ultrasound guidance: With a 25-gauge 1 inch needle patient was injected with 0.5 cc of 0.5% Marcaine and 0.5 cc of Kenalog 40 mg/mL Completed without difficulty  Pain immediately resolved suggesting accurate placement of the medication.  Advised to call if fevers/chills, erythema, induration, drainage, or persistent bleeding.  Images permanently stored and available for review in the ultrasound unit.  Impression: Technically successful ultrasound guided injection.   Impression and Recommendations:     This case required medical decision making of moderate complexity.      Note: This dictation was prepared with Dragon dictation along with smaller phrase technology. Any transcriptional errors that result from this process are unintentional.

## 2018-06-05 NOTE — Assessment & Plan Note (Signed)
Abnormal findings on x-ray.  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  Patient is in agreement with the plan.  Depending on findings he could be a candidate for possible injection.

## 2018-06-05 NOTE — Patient Instructions (Signed)
Good to see you  William Bowman is your friend.  Injected 2 area on your shoulder today  Keep hands within peripheral vision  We will get MRi of your back  Call [phone] to schedule I will write you with results For the shoulder though have a follow up in 4 weeks

## 2018-06-19 ENCOUNTER — Inpatient Hospital Stay: Admission: RE | Admit: 2018-06-19 | Payer: BLUE CROSS/BLUE SHIELD | Source: Ambulatory Visit

## 2018-07-01 ENCOUNTER — Other Ambulatory Visit: Payer: BLUE CROSS/BLUE SHIELD

## 2018-09-19 ENCOUNTER — Other Ambulatory Visit: Payer: Self-pay

## 2018-09-19 ENCOUNTER — Encounter (HOSPITAL_COMMUNITY): Payer: Self-pay | Admitting: Obstetrics and Gynecology

## 2018-09-19 ENCOUNTER — Emergency Department (HOSPITAL_COMMUNITY): Payer: BLUE CROSS/BLUE SHIELD

## 2018-09-19 ENCOUNTER — Emergency Department (HOSPITAL_COMMUNITY)
Admission: EM | Admit: 2018-09-19 | Discharge: 2018-09-19 | Disposition: A | Discharge status: Home / Self Care | Payer: BLUE CROSS/BLUE SHIELD | Attending: Emergency Medicine | Admitting: Emergency Medicine

## 2018-09-19 DIAGNOSIS — X500XXA Overexertion from strenuous movement or load, initial encounter: Secondary | ICD-10-CM | POA: Insufficient documentation

## 2018-09-19 DIAGNOSIS — M79671 Pain in right foot: Secondary | ICD-10-CM | POA: Diagnosis not present

## 2018-09-19 DIAGNOSIS — Y999 Unspecified external cause status: Secondary | ICD-10-CM | POA: Diagnosis not present

## 2018-09-19 DIAGNOSIS — Y939 Activity, unspecified: Secondary | ICD-10-CM | POA: Insufficient documentation

## 2018-09-19 DIAGNOSIS — S99911A Unspecified injury of right ankle, initial encounter: Secondary | ICD-10-CM | POA: Diagnosis not present

## 2018-09-19 DIAGNOSIS — Y929 Unspecified place or not applicable: Secondary | ICD-10-CM | POA: Insufficient documentation

## 2018-09-19 DIAGNOSIS — S93401A Sprain of unspecified ligament of right ankle, initial encounter: Secondary | ICD-10-CM | POA: Insufficient documentation

## 2018-09-19 DIAGNOSIS — M25571 Pain in right ankle and joints of right foot: Secondary | ICD-10-CM | POA: Diagnosis not present

## 2018-09-19 DIAGNOSIS — F1290 Cannabis use, unspecified, uncomplicated: Secondary | ICD-10-CM | POA: Insufficient documentation

## 2018-09-19 DIAGNOSIS — M7989 Other specified soft tissue disorders: Secondary | ICD-10-CM | POA: Diagnosis not present

## 2018-09-19 DIAGNOSIS — S99921A Unspecified injury of right foot, initial encounter: Secondary | ICD-10-CM | POA: Diagnosis present

## 2018-09-19 DIAGNOSIS — Y9301 Activity, walking, marching and hiking: Secondary | ICD-10-CM | POA: Insufficient documentation

## 2018-09-19 DIAGNOSIS — Z79899 Other long term (current) drug therapy: Secondary | ICD-10-CM | POA: Diagnosis not present

## 2018-09-19 MED ORDER — IBUPROFEN 200 MG PO TABS
600.0000 mg | ORAL_TABLET | Freq: Once | ORAL | Status: AC
  Administered 2018-09-19: 600 mg via ORAL
  Filled 2018-09-19: qty 3

## 2018-09-19 NOTE — Discharge Instructions (Signed)
No fractures, you have an ankle sprain.  Please use brace and crutches, ice and elevate the ankle as much as possible.  Ibuprofen and Tylenol as needed for pain.  If symptoms are not improving after 1 week please follow-up with your primary care doctor.

## 2018-09-19 NOTE — ED Provider Notes (Signed)
[facility] DEPT Provider Note   CSN: [id number] Arrival date & time: 09/19/18  1524     History   Chief Complaint Chief Complaint  Patient presents with  . Foot Pain    Right    HPI Dyan Creelman is a 42 y.o. male.  Bao Bazen is a 42 y.o. Male history of asthma, presents to the ED for evaluation of pain in his right foot and ankle.  Patient reports that he was walking and his foot went into a hole causing him to twist his right ankle earlier today.  He reports initially he was walking on it and felt like it was okay as he was walking around at the grocery store pain became increasingly severe.  He reports pain and swelling over the lateral malleolus and the top of the foot.  No prior injury or surgeries to this ankle.  Denies numbness or tingling.  Able to wiggle toes with some discomfort.  He has not taken anything for pain to arrival, denies any other aggravating or alleviating factors.  No pain at the knee or hip.     Past Medical History:  Diagnosis Date  . Asthma   . Pneumonia     Patient Active Problem List   Diagnosis Date Noted  . Bursitis of right shoulder 06/05/2018  . Polyarthralgia 04/15/2018  . 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 Low back pain with right-sided sciatica 04/15/2018    History reviewed. No pertinent surgical history.      Home Medications    Prior to Admission medications   Medication Sig Start Date End Date Taking? Authorizing Provider  albuterol (PROVENTIL HFA;VENTOLIN HFA) 108 (90 Base) MCG/ACT inhaler Inhale 2 puffs into the lungs every 4 (four) hours as needed for wheezing or shortness of breath (cough). 02/01/18   Leamon Arnt, MD  cetirizine (ZYRTEC) 10 MG tablet Take 10 mg by mouth daily as needed for allergies.    [provider]  clindamycin (CLEOCIN) 150 MG capsule Take 3 capsules (450 mg total) by mouth 3 (three) times daily. 02/24/18   Petrucelli, Samantha R, PA-C    erythromycin ophthalmic ointment Place a 1/2 inch ribbon of ointment into the lower eyelid 4-6 times per day for the next 7 days. 02/24/18   Petrucelli, Samantha R, PA-C  fluticasone (FLONASE) 50 MCG/ACT nasal spray Place 1 spray into both nostrils daily. 01/01/18   Billie Ruddy, MD  gabapentin (NEURONTIN) 100 MG capsule Take 2 capsules (200 mg total) by mouth at bedtime. 04/15/18   Lyndal Pulley, DO  naproxen (NAPROSYN) 500 MG tablet Take 1 tablet (500 mg total) by mouth 2 (two) times daily. 02/24/18   Petrucelli, Samantha R, PA-C  triamcinolone cream (KENALOG) 0.1 % Apply 1 application topically 2 (two) times daily. 04/10/18   Billie Ruddy, MD  Vitamin D, Ergocalciferol, (DRISDOL) 50000 units CAPS capsule Take 1 capsule (50,000 Units total) by mouth every 7 (seven) days. 02/01/18   Leamon Arnt, MD    Family History No family history on file.  Social History Social History   Tobacco Use  . Smoking status: Never Smoker  . Smokeless tobacco: Never Used  Substance Use Topics  . Alcohol use: Yes    Comment: ocassionally beer and/or liquor  . Drug use: Yes    Frequency: 2.0 times per week    Types: Marijuana     Allergies   Patient has no known allergies.   Review of Systems Review of  Systems  Constitutional: Negative for chills and fever.  Musculoskeletal: Positive for arthralgias and joint swelling.  Skin: Negative for color change and wound.  Neurological: Negative for weakness and numbness.     Physical Exam Updated Vital Signs BP (!) 154/54   Pulse (!) 104   Temp 98.6 F (37 C)   Resp 16   SpO2 98%   Physical Exam  Constitutional: He appears well-developed and well-nourished. No distress.  HENT:  Head: Normocephalic and atraumatic.  Eyes: Right eye exhibits no discharge. Left eye exhibits no discharge.  Pulmonary/Chest: Effort normal. No respiratory distress.  Musculoskeletal:  There is swelling and tenderness over the lateral malleolus.No overt  deformity. No tenderness over the medial aspect of the ankle. The fifth metatarsal is not tender. The ankle joint is intact without excessive opening on stressing.  2+ DP and TP pulses, sensation intact.  Neurological: He is alert. Coordination normal.  Skin: Skin is warm and dry. Capillary refill takes less than 2 seconds. He is not diaphoretic.  Psychiatric: He has a normal mood and affect. His behavior is normal.  Nursing note and vitals reviewed.    ED Treatments / Results  Labs (all labs ordered are listed, but only abnormal results are displayed) Labs Reviewed - No data to display  EKG None  Radiology Dg Ankle Complete Right  Result Date: 09/19/2018 CLINICAL DATA:  Foot pain after stepping in an animal burrow. EXAM: RIGHT ANKLE - COMPLETE 3+ VIEW COMPARISON:  None. FINDINGS: There is no evidence of fracture, dislocation, or joint effusion. There is no evidence of arthropathy or other focal bone abnormality. Soft tissues are unremarkable. IMPRESSION: No fracture or dislocation of the right ankle. Electronically Signed   By: Ulyses Jarred M.D.   On: 09/19/2018 16:57   Dg Foot Complete Right  Result Date: 09/19/2018 CLINICAL DATA:  Patient stepped into a hole and presents with lateral ankle pain radiating to the metatarsals. EXAM: RIGHT FOOT COMPLETE - 3+ VIEW COMPARISON:  None. FINDINGS: Mild pes planus. 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 Minimal enthesopathy along the plantar and dorsal aspect of the calcaneus. No acute fracture or joint dislocation. Lisfranc articulation appears congruent. Mild soft tissue swelling is noted about the ankle midfoot. IMPRESSION: Soft tissue swelling without acute fracture or joint dislocation. Electronically Signed   By: Ashley Royalty M.D.   On: 09/19/2018 16:56    Procedures Procedures (including critical care time)  Medications Ordered in ED Medications  ibuprofen (ADVIL,MOTRIN) tablet 600 mg (has no administration  in time range)     Initial Impression / Assessment and Plan / ED Course  I have reviewed the triage vital signs and the nursing notes.  Pertinent labs & imaging results that were available during my care of the patient were reviewed by me and considered in my medical decision making (see chart for details).  Presentation consistent with ankle sprain. Tenderness and swelling over materal malleolus, pt is neurovascularly intact, and x-ray negative for fracture, and shows ankle mortise is intact. Pain treated in the ED. Pt placed in ASO brace and provided crutches, ambulated without difficulty. Pt stable for discharge home with ibuprofen for pain. Pt to follow-up with PCP in one week if symptoms not improving. Return precautions discussed, Pt expresses understanding and agrees with plan.   Final Clinical Impressions(s) / ED Diagnoses   Final diagnoses:  Sprain of right ankle, unspecified ligament, initial encounter    ED Discharge Orders    None  Jacqlyn Larsen, PA-C 09/19/18 1716    Duffy Bruce, MD 09/20/18 [phone]

## 2018-09-19 NOTE — ED Triage Notes (Signed)
Per Pt:  Pt reports pain in his right foot. Pt reports he fell in a hole and all his weight twisted his right foot.

## 2019-01-17 ENCOUNTER — Ambulatory Visit: Payer: BLUE CROSS/BLUE SHIELD | Admitting: Family Medicine

## 2019-01-17 ENCOUNTER — Encounter: Payer: Self-pay | Admitting: Family Medicine

## 2019-01-17 VITALS — BP 104/76 | HR 78 | Temp 98.3°F | Wt 237.0 lb

## 2019-01-17 DIAGNOSIS — R2689 Other abnormalities of gait and mobility: Secondary | ICD-10-CM

## 2019-01-17 DIAGNOSIS — R0683 Snoring: Secondary | ICD-10-CM | POA: Diagnosis not present

## 2019-01-17 DIAGNOSIS — M7631 Iliotibial band syndrome, right leg: Secondary | ICD-10-CM

## 2019-01-17 DIAGNOSIS — F419 Anxiety disorder, unspecified: Secondary | ICD-10-CM

## 2019-01-17 DIAGNOSIS — M5416 Radiculopathy, lumbar region: Secondary | ICD-10-CM

## 2019-01-17 NOTE — Progress Notes (Signed)
Subjective:    Patient ID: William Bowman, male    DOB: 12/31/1975, 43 y.o.   MRN: [id number]  No chief complaint on file.   HPI Patient was seen today for several ongoing concerns.  Pt endorses increased anxiety, increased sleeping and lack of interest in doing things, and increased snoring x several months.  Pt also notes a tightness in the center of his chest times several days along with pains all over and numbness in toes.  Pt also feels low back pain around his ribs as well as pain with standing on both feet.  Pt also feels like he has problems w/ his balance.  He denies any falls.  Pt has been seen by Sports med, Dr. Tamala Julian, for low back pain w/ R sciatica and injections.  Pt was to f/u and have an MRI done, but he got scared it would show something bad so he did not have it done.    Past Medical History:  Diagnosis Date  . Asthma   . Pneumonia     No Known Allergies  ROS General: Denies fever, chills, night sweats, changes in weight, changes in appetite  +snoring, balance concern HEENT: Denies headaches, ear pain, changes in vision, rhinorrhea, sore throat CV: Denies CP, palpitations, SOB, orthopnea Pulm: Denies SOB, cough, wheezing GI: Denies abdominal pain, nausea, vomiting, diarrhea, constipation GU: Denies dysuria, hematuria, frequency, vaginal discharge Msk: Denies muscle cramps, joint pains   +low back pain Neuro: Denies weakness, numbness, tingling   +balance concern Skin: Denies rashes, bruising Psych: Denies depression, hallucinations  +anxiety    Objective:    Blood pressure 104/76, pulse 78, temperature 98.3 F (36.8 C), temperature source Oral, weight 237 lb (107.5 kg), SpO2 97 %.  Gen. Pleasant, well-nourished, in no distress, normal affect   HEENT: /AT, face symmetric, no scleral icterus, PERRLA, nares patent without drainage.  TMs normal bilaterally.  No cervical lymphadenopathy. Lungs: no accessory muscle use, CTAB, no wheezes or rales Cardiovascular:  RRR, no m/r/g, no peripheral edema Abdomen: BS present, soft, NT/ND, no hepatosplenomegaly. Musculoskeletal: No TTP of chest or ribs.  TTP of right lateral thigh from hip to knee.  TTP midline lower lumbar spine with pain radiating to right leg.  No TTP of paraspinal muscles, cervical, or thoracic spine.  No deformities, no cyanosis or clubbing, normal tone Neuro:  A&Ox3, CN II-XII intact, normal gait Skin:  Warm, no lesions/ rash   Wt Readings from Last 3 Encounters:  01/17/19 237 lb (107.5 kg)  06/05/18 230 lb (104.3 kg)  04/15/18 232 lb (105.2 kg)    Lab Results  Component Value Date   WBC 4.2 01/30/2018   HGB 14.1 01/30/2018   HCT 41.5 01/30/2018   PLT 213.0 01/30/2018   GLUCOSE 88 01/30/2018   CHOL 205 (H) 01/30/2018   TRIG 94.0 01/30/2018   HDL 48.70 01/30/2018   LDLCALC 137 (H) 01/30/2018   ALT 27 01/30/2018   AST 18 01/30/2018   NA 140 01/30/2018   K 4.2 01/30/2018   CL 104 01/30/2018   CREATININE 0.86 01/30/2018   BUN 12 01/30/2018   CO2 30 01/30/2018   TSH 0.76 01/30/2018    Assessment/Plan:  Iliotibial band syndrome of right side -Discussed stretching and massage -Given handout  Lumbar back pain with radiculopathy affecting lower extremity  -Patient encouraged to call and schedule MRI of spine.  Order placed by Dr. Tamala Julian -Patient encouraged to follow-up with Dr. Tamala Julian, sports medicine - Plan: CANCELED: MR Lumbar Spine Wo  Contrast  Balance problem -Discussed options including yoga, physical therapy -Pt wishes to wait until MRI is done.  Snoring -Referral to pulmonology for sleep study - Plan: Ambulatory referral to Pulmonology  Anxiety -PHQ 9 score 15 -Gad 7 score 16 -Discussed various options of therapy -Pt given information on area [facility] providers.  Encouraged to make an appointment  Follow-up in 1 month if needed  Grier Mitts, MD

## 2019-01-17 NOTE — Patient Instructions (Addendum)
Iliotibial Band Syndrome Rehab Ask your health care provider which exercises are safe for you. Do exercises exactly as told by your health care provider and adjust them as directed. It is normal to feel mild stretching, pulling, tightness, or discomfort as you do these exercises, but you should stop right away if you feel sudden pain or your pain gets worse.Do not begin these exercises until told by your health care provider. Stretching and range of motion exercises These exercises warm up your muscles and joints and improve the movement and flexibility of your hip and pelvis. Exercise A: Quadriceps, prone  1. Lie on your abdomen on a firm surface, such as a bed or padded floor. 2. Bend your left / right knee and hold your ankle. If you cannot reach your ankle or pant leg, loop a belt around your foot and grab the belt instead. 3. Gently pull your heel toward your buttocks. Your knee should not slide out to the side. You should feel a stretch in the front of your thigh and knee. 4. Hold this position for __________ seconds. Repeat __________ times. Complete this stretch __________ times a day. Exercise B: Iliotibial band  1. Lie on your side with your left / right leg in the top position. 2. Bend both of your knees and grab your left / right ankle. Stretch out your bottom arm to help you balance. 3. Slowly bring your top knee back so your thigh goes behind your trunk. 4. Slowly lower your top leg toward the floor until you feel a gentle stretch on the outside of your left / right hip and thigh. If you do not feel a stretch and your knee will not fall farther, place the heel of your other foot on top of your knee and pull your knee down toward the floor with your foot. 5. Hold this position for __________ seconds. Repeat __________ times. Complete this stretch __________ times a day. Strengthening exercises These exercises build strength and endurance in your hip and pelvis. Endurance is the  ability to use your muscles for a long time, even after they get tired. Exercise C: Straight leg raises (hip abductors)  1. Lie on your side with your left / right leg in the top position. Lie so your head, shoulder, knee, and hip line up. You may bend your bottom knee to help you balance. 2. Roll your hips slightly forward so your hips are stacked directly over each other and your left / right knee is facing forward. 3. Tense the muscles in your outer thigh and lift your top leg 4-6 inches (10-15 cm). 4. Hold this position for __________ seconds. 5. Slowly return to the starting position. Let your muscles relax completely before doing another repetition. Repeat __________ times. Complete this exercise __________ times a day. Exercise D: Straight leg raises (hip extensors) 1. Lie on your abdomen on your bed or a firm surface. You can put a pillow under your hips if that is more comfortable. 2. Bend your left / right knee so your foot is straight up in the air. 3. Squeeze your buttock muscles and lift your left / right thigh off the bed. Do not let your back arch. 4. Tense this muscle as hard as you can without increasing any knee pain. 5. Hold this position for __________ seconds. 6. Slowly lower your leg to the starting position and allow it to relax completely. Repeat __________ times. Complete this exercise __________ times a day. Exercise E: Hip hike  1. Stand sideways on a bottom step. Stand on your left / right leg with your other foot unsupported next to the step. You can hold onto the railing or wall if needed for balance. 2. Keep your knees straight and your torso square. Then, lift your left / right hip up toward the ceiling. 3. Slowly let your left / right hip lower toward the floor, past the starting position. Your foot should get closer to the floor. Do not lean or bend your knees. Repeat __________ times. Complete this exercise __________ times a day. This information is not  intended to replace advice given to you by your health care provider. Make sure you discuss any questions you have with your health care provider. Document Released: 12/11/2005 Document Revised: 08/15/2016 Document Reviewed: 11/12/2015 Elsevier Interactive Patient Education  2019 [employer]  Radicular Pain Radicular pain is a type of pain that spreads from your back or neck along a spinal nerve. Spinal nerves are nerves that leave the spinal cord and go to the muscles. Radicular pain is sometimes called radiculopathy, radiculitis, or a pinched nerve. When you have this type of pain, you may also have weakness, numbness, or tingling in the area of your body that is supplied by the nerve. The pain may feel sharp and burning. Depending on which spinal nerve is affected, the pain may occur in the:  Neck area (cervical radicular pain). You may also feel pain, numbness, weakness, or tingling in the arms.  Mid-spine area (thoracic radicular pain). You would feel this pain in the back and chest. This type is rare.  Lower back area (lumbar radicular pain). You would feel this pain as low back pain. You may feel pain, numbness, weakness, or tingling in the buttocks or legs. Sciatica is a type of lumbar radicular pain that shoots down the back of the leg. Radicular pain occurs when one of the spinal nerves becomes irritated or squeezed (compressed). It is often caused by something pushing on a spinal nerve, such as one of the bones of the spine (vertebrae) or one of the round cushions between vertebrae (intervertebral disks). This can result from:  An injury.  Wear and tear or aging of a disk.  The growth of a bone spur that pushes on the nerve. Radicular pain often goes away when you follow instructions from your health care provider for relieving pain at home. Follow these instructions at home: Managing pain      If directed, put ice on the affected area: ? Put ice in a plastic bag. ? Place a  towel between your skin and the bag. ? Leave the ice on for 20 minutes, 2-3 times a day.  If directed, apply heat to the affected area as often as told by your health care provider. Use the heat source that your health care provider recommends, such as a moist heat pack or a heating pad. ? Place a towel between your skin and the heat source. ? Leave the heat on for 20-30 minutes. ? Remove the heat if your skin turns bright red. This is especially important if you are unable to feel pain, heat, or cold. You may have a greater risk of getting burned. Activity   Do not sit or rest in bed for long periods of time.  Try to stay as active as possible. Ask your health care provider what type of exercise or activity is best for you.  Avoid activities that make your pain worse, such as bending  and lifting.  Do not lift anything that is heavier than 10 lb (4.5 kg), or the limit that you are told, until your health care provider says that it is safe.  Practice using proper technique when lifting items. Proper lifting technique involves bending your knees and rising up.  Do strength and range-of-motion exercises only as told by your health care provider or physical therapist. General instructions  Take over-the-counter and prescription medicines only as told by your health care provider.  Pay attention to any changes in your symptoms.  Keep all follow-up visits as told by your health care provider. This is important. ? Your health care provider may send you to a physical therapist to help with this pain. Contact a health care provider if:  Your pain and other symptoms get worse.  Your pain medicine is not helping.  Your pain has not improved after a few weeks of home care.  You have a fever. Get help right away if:  You have severe pain, weakness, or numbness.  You have difficulty with bladder or bowel control. Summary  Radicular pain is a type of pain that spreads from your back or  neck along a spinal nerve.  When you have radicular pain, you may also have weakness, numbness, or tingling in the area of your body that is supplied by the nerve.  The pain may feel sharp or burning.  Radicular pain may be treated with ice, heat, medicines, or physical therapy. This information is not intended to replace advice given to you by your health care provider. Make sure you discuss any questions you have with your health care provider. Document Released: 01/18/2005 Document Revised: 06/25/2018 Document Reviewed: 06/25/2018 Elsevier Interactive Patient Education  2019 [place]  After being diagnosed with an anxiety disorder, you may be relieved to know why you have felt or behaved a certain way. It is natural to also feel overwhelmed about the treatment ahead and what it will mean for your life. With care and support, you can manage this condition and recover from it. How to cope with anxiety Dealing with stress Stress is your body's reaction to life changes and events, both good and bad. Stress can last just a few hours or it can be ongoing. Stress can play a major role in anxiety, so it is important to learn both how to cope with stress and how to think about it differently. Talk with your health care provider or a counselor to learn more about stress reduction. He or she may suggest some stress reduction techniques, such as:  Music therapy. This can include creating or listening to music that you enjoy and that inspires you.  Mindfulness-based meditation. This involves being aware of your normal breaths, rather than trying to control your breathing. It can be done while sitting or walking.  Centering prayer. This is a kind of meditation that involves focusing on a word, phrase, or sacred image that is meaningful to you and that brings you peace.  Deep breathing. To do this, expand your stomach and inhale slowly through your nose. Hold your breath for  3-5 seconds. Then exhale slowly, allowing your stomach muscles to relax.  Self-talk. This is a skill where you identify thought patterns that lead to anxiety reactions and correct those thoughts.  Muscle relaxation. This involves tensing muscles then relaxing them. Choose a stress reduction technique that fits your lifestyle and personality. Stress reduction techniques take time and practice. Set aside 5-15  minutes a day to do them. Therapists can offer training in these techniques. The training may be covered by some insurance plans. Other things you can do to manage stress include:  Keeping a stress diary. This can help you learn what triggers your stress and ways to control your response.  Thinking about how you respond to certain situations. You may not be able to control everything, but you can control your reaction.  Making time for activities that help you relax, and not feeling guilty about spending your time in this way. Therapy combined with coping and stress-reduction skills provides the best chance for successful treatment. Medicines Medicines can help ease symptoms. Medicines for anxiety include:  Anti-anxiety drugs.  Antidepressants.  Beta-blockers. Medicines may be used as the main treatment for anxiety disorder, along with therapy, or if other treatments are not working. Medicines should be prescribed by a health care provider. Relationships Relationships can play a big part in helping you recover. Try to spend more time connecting with trusted friends and family members. Consider going to couples counseling, taking family education classes, or going to family therapy. Therapy can help you and others better understand the condition. How to recognize changes in your condition Everyone has a different response to treatment for anxiety. Recovery from anxiety happens when symptoms decrease and stop interfering with your daily activities at home or work. This may mean that you  will start to:  Have better concentration and focus.  Sleep better.  Be less irritable.  Have more energy.  Have improved memory. It is important to recognize when your condition is getting worse. Contact your health care provider if your symptoms interfere with home or work and you do not feel like your condition is improving. Where to find help and support: You can get help and support from these sources:  Self-help groups.  Online and [employer].  A trusted spiritual leader.  Couples counseling.  Family education classes.  Family therapy. Follow these instructions at home:  Eat a healthy diet that includes plenty of vegetables, fruits, whole grains, low-fat dairy products, and lean protein. Do not eat a lot of foods that are high in solid fats, added sugars, or salt.  Exercise. Most adults should do the following: ? Exercise for at least 150 minutes each week. The exercise should increase your heart rate and make you sweat (moderate-intensity exercise). ? Strengthening exercises at least twice a week.  Cut down on caffeine, tobacco, alcohol, and other potentially harmful substances.  Get the right amount and quality of sleep. Most adults need 7-9 hours of sleep each night.  Make choices that simplify your life.  Take over-the-counter and prescription medicines only as told by your health care provider.  Avoid caffeine, alcohol, and certain over-the-counter cold medicines. These may make you feel worse. Ask your pharmacist which medicines to avoid.  Keep all follow-up visits as told by your health care provider. This is important. Questions to ask your health care provider  Would I benefit from therapy?  How often should I follow up with a health care provider?  How long do I need to take medicine?  Are there any long-term side effects of my medicine?  Are there any alternatives to taking medicine? Contact a health care provider if:  You have a  hard time staying focused or finishing daily tasks.  You spend many hours a day feeling worried about everyday life.  You become exhausted by worry.  You start to have headaches,  feel tense, or have nausea.  You urinate more than normal.  You have diarrhea. Get help right away if:  You have a racing heart and shortness of breath.  You have thoughts of hurting yourself or others. If you ever feel like you may hurt yourself or others, or have thoughts about taking your own life, get help right away. You can go to your nearest emergency department or call:  Your local emergency services (911 in the [place]  A suicide crisis helpline, such as the [place] at 951-007-3438. This is open 24-hours a day. Summary  Taking steps to deal with stress can help calm you.  Medicines cannot cure anxiety disorders, but they can help ease symptoms.  Family, friends, and partners can play a big part in helping you recover from an anxiety disorder. This information is not intended to replace advice given to you by your health care provider. Make sure you discuss any questions you have with your health care provider. Document Released: 12/05/2016 Document Revised: 12/05/2016 Document Reviewed: 12/05/2016 Elsevier Interactive Patient Education  2019 [employer].

## 2019-01-20 ENCOUNTER — Ambulatory Visit: Payer: Self-pay | Admitting: Allergy

## 2019-01-21 ENCOUNTER — Other Ambulatory Visit: Payer: BLUE CROSS/BLUE SHIELD

## 2019-01-21 ENCOUNTER — Encounter: Payer: Self-pay | Admitting: Family Medicine

## 2019-01-21 DIAGNOSIS — F419 Anxiety disorder, unspecified: Secondary | ICD-10-CM | POA: Insufficient documentation

## 2019-02-17 ENCOUNTER — Ambulatory Visit: Payer: BLUE CROSS/BLUE SHIELD | Admitting: Family Medicine

## 2019-04-16 ENCOUNTER — Ambulatory Visit (INDEPENDENT_AMBULATORY_CARE_PROVIDER_SITE_OTHER): Payer: BLUE CROSS/BLUE SHIELD | Admitting: Allergy

## 2019-04-16 ENCOUNTER — Encounter: Payer: Self-pay | Admitting: Allergy

## 2019-04-16 ENCOUNTER — Other Ambulatory Visit: Payer: Self-pay

## 2019-04-16 VITALS — BP 126/86 | HR 109 | Resp 16 | Ht 67.0 in | Wt 240.0 lb

## 2019-04-16 DIAGNOSIS — R0602 Shortness of breath: Secondary | ICD-10-CM | POA: Insufficient documentation

## 2019-04-16 DIAGNOSIS — J3089 Other allergic rhinitis: Secondary | ICD-10-CM | POA: Diagnosis not present

## 2019-04-16 DIAGNOSIS — J4530 Mild persistent asthma, uncomplicated: Secondary | ICD-10-CM | POA: Diagnosis not present

## 2019-04-16 DIAGNOSIS — J45909 Unspecified asthma, uncomplicated: Secondary | ICD-10-CM | POA: Insufficient documentation

## 2019-04-16 DIAGNOSIS — H1013 Acute atopic conjunctivitis, bilateral: Secondary | ICD-10-CM | POA: Diagnosis not present

## 2019-04-16 DIAGNOSIS — J4520 Mild intermittent asthma, uncomplicated: Secondary | ICD-10-CM | POA: Diagnosis not present

## 2019-04-16 MED ORDER — BUDESONIDE-FORMOTEROL FUMARATE 80-4.5 MCG/ACT IN AERO: 2.0000 | INHALATION_SPRAY | Freq: Two times a day (BID) | RESPIRATORY_TRACT | 5 refills | Status: DC

## 2019-04-16 MED ORDER — FLUTICASONE PROPIONATE 93 MCG/ACT NA EXHU: 2.0000 | INHALANT_SUSPENSION | Freq: Two times a day (BID) | NASAL | 5 refills | Status: DC

## 2019-04-16 MED ORDER — ALBUTEROL SULFATE HFA 108 (90 BASE) MCG/ACT IN AERS: 2.0000 | INHALATION_SPRAY | RESPIRATORY_TRACT | 5 refills | Status: DC | PRN

## 2019-04-16 MED ORDER — OLOPATADINE HCL 0.2 % OP SOLN: 1.0000 [drp] | Freq: Every day | OPHTHALMIC | 5 refills | Status: DC | PRN

## 2019-04-16 NOTE — Patient Instructions (Addendum)
Today's skin testing showed:  Positive to grass, weed, ragweed, trees, mold, dust mites, dog, cat, cockroach  Start environmental control measures.  May use over the counter antihistamines such as Zyrtec (cetirizine), Claritin (loratadine), Allegra (fexofenadine), or Xyzal (levocetirizine) daily as needed.  Start Xhance 2 sprays twice a day.  Demonstrated proper use and sample given.  Nasal saline spray (i.e., Simply Saline) or nasal saline lavage (i.e., NeilMed) is recommended as needed and prior to medicated nasal sprays.  May use Pazeo 1 drop in each eye daily as needed for itchy/watery eyes.   Get an eye exam.   Breathing: . Daily controller medication(s): start Symbicort 80 2 puffs twice a day with spacer and rinse mouth afterwards. . Prior to physical activity: May use albuterol rescue inhaler 2 puffs 5 to 15 minutes prior to strenuous physical activities. Marland Kitchen Rescue medications: May use albuterol rescue inhaler 2 puffs or nebulizer every 4 to 6 hours as needed for shortness of breath, chest tightness, coughing, and wheezing. Monitor frequency of use.  . Asthma control goals:  o Full participation in all desired activities (may need albuterol before activity) o Albuterol use two times or less a week on average (not counting use with activity) o Cough interfering with sleep two times or less a month o Oral steroids no more than once a year o No hospitalizations  Follow up in 2 months   Reducing Pollen Exposure . Pollen seasons: trees (spring), grass (summer) and ragweed/weeds (fall). Marland Kitchen Keep windows closed in your home and car to lower pollen exposure.  Susa Simmonds air conditioning in the bedroom and throughout the house if possible.  . Avoid going out in dry windy days - especially early morning. . Pollen counts are highest between 5 - 10 AM and on dry, hot and windy days.  . Save outside activities for late afternoon or after a heavy rain, when pollen levels are lower.  . Avoid  mowing of grass if you have grass pollen allergy. Marland Kitchen Be aware that pollen can also be transported indoors on people and pets.  . Dry your clothes in an automatic dryer rather than hanging them outside where they might collect pollen.  . Rinse hair and eyes before bedtime.  Mold Control . Mold and fungi can grow on a variety of surfaces provided certain temperature and moisture conditions exist.  . Outdoor molds grow on plants, decaying vegetation and soil. The major outdoor mold, Alternaria and Cladosporium, are found in very high numbers during hot and dry conditions. Generally, a late summer - fall peak is seen for common outdoor fungal spores. Rain will temporarily lower outdoor mold spore count, but counts rise rapidly when the rainy period ends. . The most important indoor molds are Aspergillus and Penicillium. Dark, humid and poorly ventilated basements are ideal sites for mold growth. The next most common sites of mold growth are the bathroom and the kitchen. Outdoor (Seasonal) Mold Control . Use air conditioning and keep windows closed. . Avoid exposure to decaying vegetation. Marland Kitchen Avoid leaf raking. . Avoid grain handling. . Consider wearing a face mask if working in moldy areas.  Indoor (Perennial) Mold Control  . Maintain humidity below 50%. . Get rid of mold growth on hard surfaces with water, detergent and, if necessary, 5% bleach (do not mix with other cleaners). Then dry the area completely. If mold covers an area more than 10 square feet, consider hiring an Patent examiner. . For clothing, washing with soap and water  is best. If moldy items cannot be cleaned and dried, throw them away. . Remove sources e.g. contaminated carpets. . Repair and seal leaking roofs or pipes. Using dehumidifiers in damp basements may be helpful, but empty the water and clean units regularly to prevent mildew from forming. All rooms, especially basements, bathrooms and kitchens, require  ventilation and cleaning to deter mold and mildew growth. Avoid carpeting on concrete or damp floors, and storing items in damp areas. Control of House Dust Mite Allergen . Dust mite allergens are a common trigger of allergy and asthma symptoms. While they can be found throughout the house, these microscopic creatures thrive in warm, humid environments such as bedding, upholstered furniture and carpeting. . Because so much time is spent in the bedroom, it is essential to reduce mite levels there.  . Encase pillows, mattresses, and box springs in special allergen-proof fabric covers or airtight, zippered plastic covers.  . Bedding should be washed weekly in hot water (130 F) and dried in a hot dryer. Allergen-proof covers are available for comforters and pillows that can't be regularly washed.  William Bowman the allergy-proof covers every few months. Minimize clutter in the bedroom. Keep pets out of the bedroom.  Marland Kitchen Keep humidity less than 50% by using a dehumidifier or air conditioning. You can buy a humidity measuring device called a hygrometer to monitor this.  . If possible, replace carpets with hardwood, linoleum, or washable area rugs. If that's not possible, vacuum frequently with a vacuum that has a HEPA filter. . Remove all upholstered furniture and non-washable window drapes from the bedroom. . Remove all non-washable stuffed toys from the bedroom.  Wash stuffed toys weekly. Pet Allergen Avoidance: . Contrary to popular opinion, there are no "hypoallergenic" breeds of dogs or cats. That is because people are not allergic to an animal's hair, but to an allergen found in the animal's saliva, dander (dead skin flakes) or urine. Pet allergy symptoms typically occur within minutes. For some people, symptoms can build up and become most severe 8 to 12 hours after contact with the animal. People with severe allergies can experience reactions in public places if dander has been transported on the pet owners'  clothing. Marland Kitchen Keeping an animal outdoors is only a partial solution, since homes with pets in the yard still have higher concentrations of animal allergens. . Before getting a pet, ask your allergist to determine if you are allergic to animals. If your pet is already considered part of your family, try to minimize contact and keep the pet out of the bedroom and other rooms where you spend a great deal of time. . As with dust mites, vacuum carpets often or replace carpet with a hardwood floor, tile or linoleum. . High-efficiency particulate air (HEPA) cleaners can reduce allergen levels over time. . While dander and saliva are the source of cat and dog allergens, urine is the source of allergens from rabbits, hamsters, mice and [place] pigs; so ask a non-allergic family member to clean the animal's cage. . If you have a pet allergy, talk to your allergist about the potential for allergy immunotherapy (allergy shots). This strategy can often provide long-term relief. Cockroach Allergen Avoidance Cockroaches are often found in the homes of densely populated urban areas, schools or commercial buildings, but these creatures can lurk almost anywhere. This does not mean that you have a dirty house or living area. . Block all areas where roaches can enter the home. This includes crevices, wall cracks and  windows.  . Cockroaches need water to survive, so fix and seal all leaky faucets and pipes. Have an exterminator go through the house when your family and pets are gone to eliminate any remaining roaches. Marland Kitchen Keep food in lidded containers and put pet food dishes away after your pets are done eating. Vacuum and sweep the floor after meals, and take out garbage and recyclables. Use lidded garbage containers in the kitchen. Wash dishes immediately after use and clean under stoves, refrigerators or toasters where crumbs can accumulate. Wipe off the stove and other kitchen surfaces and cupboards regularly.  Skin care  recommendations  Bath time: . Always use lukewarm water. AVOID very hot or cold water. Marland Kitchen Keep bathing time to 5-10 minutes. . Do NOT use bubble bath. . Use a mild soap and use just enough to wash the dirty areas. . Do NOT scrub skin vigorously.  . After bathing, pat dry your skin with a towel. Do NOT rub or scrub the skin.  Moisturizers and prescriptions:  . ALWAYS apply moisturizers immediately after bathing (within 3 minutes). This helps to lock-in moisture. . Use the moisturizer several times a day over the whole body. Kermit Balo summer moisturizers include: Aveeno, CeraVe, Cetaphil. Kermit Balo winter moisturizers include: Aquaphor, Vaseline, Cerave, Cetaphil, Eucerin, Vanicream. . When using moisturizers along with medications, the moisturizer should be applied about one hour after applying the medication to prevent diluting effect of the medication or moisturize around where you applied the medications. When not using medications, the moisturizer can be continued twice daily as maintenance.  Laundry and clothing: . Avoid laundry products with added color or perfumes. . Use unscented hypo-allergenic laundry products such as Tide free, Cheer free & gentle, and All free and clear.  . If the skin still seems dry or sensitive, you can try double-rinsing the clothes. . Avoid tight or scratchy clothing such as wool. . Do not use fabric softeners or dyer sheets.

## 2019-04-16 NOTE — Assessment & Plan Note (Signed)
.   See assessment and plan as above. 

## 2019-04-16 NOTE — Progress Notes (Signed)
New Patient Note  RE: William Bowman MRN: [id number] DOB: 1976/07/08 Date of Office Visit: 04/16/2019  Referring provider: Billie Ruddy, MD Primary care provider: Billie Ruddy, MD  Chief Complaint: Asthma; Shortness of Breath; and Allergic Rhinitis   History of Present Illness: I had the pleasure of seeing William Bowman for initial evaluation at the [facility] on 04/16/2019. He is a 43 y.o. male, who is self-referred here for the evaluation of asthma and allergies.   Asthma: He reports symptoms of chest tightness, shortness of breath, coughing, wheezing for 44+ years. Current medications include none. He reports not using aerochamber with asthma inhalers. He tried the following inhalers: albuterol. Main asthma triggers are allergies, exercise, weather changes - fall and spring. Seems like he can't exhale fully. In the last month, frequency of asthma symptoms: daily with exertion. Frequency of nocturnal symptoms: 0x/month. Frequency of SABA use: 0x/week. Interference with physical activity: yes. Sleep is undisturbed. In the last 12 months, emergency room visits/urgent care visits/doctor office visits or hospitalizations due to asthma: 0. In the last 12 months, oral steroids courses: 0. Lifetime history of hospitalization for asthma: as a child 3-4 times. Prior intubations: 0. Asthma was diagnosed at age infancy. History of pneumonia: 3 times - age 68, 2001, 2011 - cxr proven. He was not evaluated by allergist/pulmonologist in the past. Smoking exposure: no. Up to date with flu vaccine: no. Referred to pulmonology but has not seen them yet.  Diagnosed with COPD in the past as well which about 1 year afterwards was told he did not have COPD. Does not recall ever having a full body PFT done.  Rhinitis: He reports symptoms of nasal congestion, rhinorrhea, sneezing, itchy/watery eyes. Symptoms have been going on for 10+ years. The symptoms are present all year around.  Anosmia: diminished sense of smell. Headache: no. He has used zyrtec, Flonase with minimal improvement in symptoms. Sinus infections: no. Previous work up includes: none. Previous ENT evaluation: no. No recent eye exam.   Assessment and Plan: William Bowman is a 43 y.o. male with: Other allergic rhinitis Perennial rhinoconjunctivitis symptoms for the last 10+ years.  Tried Zyrtec and Flonase as needed with minimal benefit.  No previous allergy testing or ENT evaluation.  Today skin testing showed: Positive to grass, weed, ragweed, trees, mold, dust mites, dog, cat, cockroach.  Discussed environmental control measures.  May use over the counter antihistamines such as Zyrtec (cetirizine), Claritin (loratadine), Allegra (fexofenadine), or Xyzal (levocetirizine) daily as needed.  Start Xhance 2 sprays twice a day.  Demonstrated proper use and sample given.  Nasal saline spray (i.e., Simply Saline) or nasal saline lavage (i.e., NeilMed) is recommended as needed and prior to medicated nasal sprays.  May use Pazeo 1 drop in each eye daily as needed for itchy/watery eyes.   Get an eye exam.   If above regimen does not control symptoms adequately then may require ENT evaluation and starting allergy immunotherapy.  Allergic conjunctivitis of both eyes  See assessment and plan as above.  Shortness of breath Episodes of shortness of breath, chest tightness, coughing and wheezing for the past 44 years.  Patient used to have an albuterol in the past but has not used one for 4 years.  Recently noted excessive shortness of breath with any kind of exertion and inability to exhale fully.  Apparently had recent normal EKG.  No previous cardiac evaluation.  History of pneumonias in the past and there was a questionable COPD  diagnosis as well.  Today's spirometry showed mixed restriction and mild obstruction with no improvement in FEV1 post bronchodilator treatment.  Patient clinically felt unchanged with  Xopenex treatment.  Given clinical symptoms and spirometry results will do a trial of Symbicort.  If patient has no improvement with this regimen then recommend getting cardiac evaluation, chest x-ray and full body PFT.  Some of his exertional shortness of breath most likely due to physical deconditioning. . Daily controller medication(s): start Symbicort 80 2 puffs twice a day with spacer and rinse mouth afterwards.  Demonstrated proper use.  Sample and spacer given. . Prior to physical activity: May use albuterol rescue inhaler 2 puffs 5 to 15 minutes prior to strenuous physical activities. Marland Kitchen Rescue medications: May use albuterol rescue inhaler 2 puffs or nebulizer every 4 to 6 hours as needed for shortness of breath, chest tightness, coughing, and wheezing. Monitor frequency of use.   Return in about 2 months (around 06/16/2019).  Meds ordered this encounter  Medications  . Fluticasone Propionate (XHANCE) 93 MCG/ACT EXHU    Sig: Place 2 sprays into the nose 2 (two) times a day.    Dispense:  16 mL    Refill:  5    Home Phone: [phone]  . Olopatadine HCl 0.2 % SOLN    Sig: Apply 1 drop to eye daily as needed.    Dispense:  2.5 mL    Refill:  5  . budesonide-formoterol (SYMBICORT) 80-4.5 MCG/ACT inhaler    Sig: Inhale 2 puffs into the lungs 2 (two) times daily.    Dispense:  1 Inhaler    Refill:  5  . Fluticasone Propionate (XHANCE) 93 MCG/ACT EXHU    Sig: Place 2 sprays into the nose 2 (two) times a day.    Dispense:  16 mL    Refill:  5  . albuterol (VENTOLIN HFA) 108 (90 Base) MCG/ACT inhaler    Sig: Inhale 2 puffs into the lungs every 4 (four) hours as needed for wheezing or shortness of breath (cough).    Dispense:  1 Inhaler    Refill:  5   Other allergy screening: Food allergy: no Medication allergy: no Hymenoptera allergy: no Urticaria: no Eczema: yes but it's well controlled.   History of recurrent infections suggestive of immunodeficency: no  Diagnostics:  Spirometry:  Tracings reviewed. His effort: Good reproducible efforts. FVC: 2.41L FEV1: 1.70L, 54% predicted FEV1/FVC ratio: 71% Interpretation: Spirometry consistent with mixed restriction and mil obstruction. No improvement in FEV1 post bronchodilator treatment and clinically feeling the same.  Please see scanned spirometry results for details.  Skin Testing: Environmental allergy panel. Positive test to: grass, weed, ragweed, trees, mold, dust mites, dog, cat, cockroach. Results discussed with patient/family. Airborne Adult Perc - 04/16/19 1452    Time Antigen Placed  1450    Allergen Manufacturer  Lavella Hammock    Location  Back    Number of Test  59    Panel 1  Select    1. Control-Buffer 50% Glycerol  Negative    2. Control-Histamine 1 mg/ml  3+    3. Albumin saline  Negative    4. [place]  4+    5. [place]  4+    6. [place]  4+    7. [place]  4+    8. Meadow Fescue  4+    9. Perennial Rye  4+    10. Sweet Vernal  4+    11. Timothy  4+    12. Cocklebur  2+    13. Burweed Mars[place]er  2+    14. Ragweed, short  3+    15. Ragweed, Giant  2+    16. Plantain,  English  2+    17. Lamb's Quarters  2+    18. Sheep Sorrell  2+    19. Rough Pigweed  2+    20. Marsh Elder, Rough  Negative    21. Mugwort, Common  4+    22. Ash mix  4+    24. Beech American  Negative    25. Box, Elder  3+    26. Cedar, red  3+    27. Cottonwood, Eastern  2+    28. Elm mix  2+    29. Hickory mix  4+    30. Maple mix  2+    31. Oak, [place] mix  4+    32. Pecan Pollen  4+    33. Pine mix  Negative    34. Sycamore Eastern  3+    35. Stonewall, Black Pollen  4+    36. Alternaria alternata  Negative    37. Cladosporium Herbarum  Negative    38. Aspergillus mix  Negative    39. Penicillium mix  Negative    40. Bipolaris sorokiniana (Helminthosporium)  3+    41. Drechslera spicifera (Curvularia)  2+    42. Mucor plumbeus  Negative    43. Fusarium moniliforme  2+    44. Aureobasidium pullulans  (pullulara)  Negative    45. Rhizopus oryzae  Negative    46. Botrytis cinera  Negative    47. Epicoccum nigrum  Negative    48. Phoma betae  Negative    49. Candida Albicans  Negative    50. Trichophyton mentagrophytes  Negative    51. Mite, D Farinae  5,000 AU/ml  4+    52. Mite, D Pteronyssinus  5,000 AU/ml  4+    53. Cat Hair 10,000 BAU/ml  3+    54.  Dog Epithelia  Negative    55. Mixed Feathers  Negative    56. Horse Epithelia  Negative    57. Cockroach, German  Negative    58. Mouse  Negative    59. Tobacco Leaf  Negative     Intradermal - 04/16/19 1556    Time Antigen Placed  1530    Allergen Manufacturer  Lavella Hammock    Location  Arm    Number of Test  5    Intradermal  Select    Control  Negative    Mold 1  Negative    Mold 2  Negative    Dog  4+    Cockroach  2+       Past Medical History: Patient Active Problem List   Diagnosis Date Noted  . Other allergic rhinitis 04/16/2019  . Shortness of breath 04/16/2019  . Allergic conjunctivitis of both eyes 04/16/2019  . Reactive airway disease 04/16/2019  . Anxiety 01/21/2019  . Bursitis of right shoulder 06/05/2018  . Polyarthralgia 04/15/2018  . 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 Low back pain with right-sided sciatica 04/15/2018   Past Medical History:  Diagnosis Date  . Asthma   . Pneumonia    Past Surgical History: History reviewed. No pertinent surgical history. Medication List:  Current Outpatient Medications  Medication Sig Dispense Refill  . gabapentin (NEURONTIN) 100 MG capsule Take 2 capsules (200 mg total) by mouth at bedtime. 60 capsule 3  . triamcinolone cream (KENALOG) 0.1 %  Apply 1 application topically 2 (two) times daily. 453.6 g 6  . albuterol (VENTOLIN HFA) 108 (90 Base) MCG/ACT inhaler Inhale 2 puffs into the lungs every 4 (four) hours as needed for wheezing or shortness of breath (cough). 1 Inhaler 5  . budesonide-formoterol (SYMBICORT) 80-4.5 MCG/ACT inhaler Inhale 2 puffs into  the lungs 2 (two) times daily. 1 Inhaler 5  . cetirizine (ZYRTEC) 10 MG tablet Take 10 mg by mouth daily as needed for allergies.    . Fluticasone Propionate (XHANCE) 93 MCG/ACT EXHU Place 2 sprays into the nose 2 (two) times a day. 16 mL 5  . Fluticasone Propionate (XHANCE) 93 MCG/ACT EXHU Place 2 sprays into the nose 2 (two) times a day. 16 mL 5  . Olopatadine HCl 0.2 % SOLN Apply 1 drop to eye daily as needed. 2.5 mL 5   No current facility-administered medications for this visit.    Allergies: No Known Allergies Social History: Social History   Socioeconomic History  . Marital status: Single    Spouse name: Not on file  . Number of children: Not on file  . Years of education: Not on file  . Highest education level: Not on file  Occupational History  . Not on file  Social Needs  . Financial resource strain: Not on file  . Food insecurity:    Worry: Not on file    Inability: Not on file  . Transportation needs:    Medical: Not on file    Non-medical: Not on file  Tobacco Use  . Smoking status: Former Smoker    Years: 0.50    Types: Cigarettes  . Smokeless tobacco: Never Used  Substance and Sexual Activity  . Alcohol use: Yes    Comment: ocassionally beer and/or liquor  . Drug use: Yes    Frequency: 2.0 times per week    Types: Marijuana  . Sexual activity: Not on file  Lifestyle  . Physical activity:    Days per week: Not on file    Minutes per session: Not on file  . Stress: Not on file  Relationships  . Social connections:    Talks on phone: Not on file    Gets together: Not on file    Attends religious service: Not on file    Active member of club or organization: Not on file    Attends meetings of clubs or organizations: Not on file    Relationship status: Not on file  Other Topics Concern  . Not on file  Social History Narrative  . Not on file   Lives in a 44 year old home. Smoking: denies Occupation: Doctor, general practice HistoryJournalist, newspaper in the house: yes Carpet in the family room: no Carpet in the bedroom: yes Heating: electric Cooling: central Pet: yes 2 dogs x 1 yr, 4 months  Family History: Family History  Adopted: Yes   Review of Systems  Constitutional: Negative for appetite change, chills, fever and unexpected weight change.  HENT: Positive for congestion, rhinorrhea and sneezing.   Eyes: Positive for discharge and itching.  Respiratory: Positive for chest tightness and shortness of breath. Negative for cough and wheezing.   Cardiovascular: Negative for chest pain.  Gastrointestinal: Negative for abdominal pain.  Genitourinary: Negative for difficulty urinating.  Skin: Negative for rash.  Allergic/Immunologic: Positive for environmental allergies. Negative for food allergies.  Neurological: Negative for headaches.   Objective: BP 126/86   Pulse (!) 109   Resp 16   Ht  5\' 7"  (1.702 m)   Wt 240 lb (108.9 kg)   SpO2 96%   BMI 37.59 kg/m  Body mass index is 37.59 kg/m. Physical Exam  Constitutional: He is oriented to person, place, and time. He appears well-developed and well-nourished.  HENT:  Head: Normocephalic and atraumatic.  Right Ear: External ear normal.  Left Ear: External ear normal.  Mouth/Throat: Oropharynx is clear and moist.  Right nasal turbinate hypertrophy  Eyes: Conjunctivae and EOM are normal.  Neck: Neck supple.  Cardiovascular: Normal rate, regular rhythm and normal heart sounds. Exam reveals no gallop and no friction rub.  No murmur heard. Pulmonary/Chest: Effort normal and breath sounds normal. He has no wheezes. He has no rales.  Abdominal: Soft.  Neurological: He is alert and oriented to person, place, and time.  Skin: Skin is warm. No rash noted.  Psychiatric: He has a normal mood and affect. His behavior is normal.  Nursing note and vitals reviewed.  The plan was reviewed with the patient/family, and all questions/concerned were addressed.  It was my  pleasure to see Isom today and participate in his care. Please feel free to contact me with any questions or concerns.  Sincerely,  Rexene Alberts, DO Allergy & Immunology  [facility] office: [phone] [facility] office: [phone]

## 2019-04-16 NOTE — Assessment & Plan Note (Signed)
Perennial rhinoconjunctivitis symptoms for the last 10+ years.  Tried Zyrtec and Flonase as needed with minimal benefit.  No previous allergy testing or ENT evaluation.  Today skin testing showed: Positive to grass, weed, ragweed, trees, mold, dust mites, dog, cat, cockroach.  Discussed environmental control measures.  May use over the counter antihistamines such as Zyrtec (cetirizine), Claritin (loratadine), Allegra (fexofenadine), or Xyzal (levocetirizine) daily as needed.  Start Xhance 2 sprays twice a day.  Demonstrated proper use and sample given.  Nasal saline spray (i.e., Simply Saline) or nasal saline lavage (i.e., NeilMed) is recommended as needed and prior to medicated nasal sprays.  May use Pazeo 1 drop in each eye daily as needed for itchy/watery eyes.   Get an eye exam.   If above regimen does not control symptoms adequately then may require ENT evaluation and starting allergy immunotherapy.

## 2019-04-16 NOTE — Assessment & Plan Note (Addendum)
Episodes of shortness of breath, chest tightness, coughing and wheezing for the past 44 years.  Patient used to have an albuterol in the past but has not used one for 4 years.  Recently noted excessive shortness of breath with any kind of exertion and inability to exhale fully.  Apparently had recent normal EKG.  No previous cardiac evaluation.  History of pneumonias in the past and there was a questionable COPD diagnosis as well.  Today's spirometry showed mixed restriction and mild obstruction with no improvement in FEV1 post bronchodilator treatment.  Patient clinically felt unchanged with Xopenex treatment.  Given clinical symptoms and spirometry results will do a trial of Symbicort.  If patient has no improvement with this regimen then recommend getting cardiac evaluation, chest x-ray and full body PFT.  Some of his exertional shortness of breath most likely due to physical deconditioning. . Daily controller medication(s): start Symbicort 80 2 puffs twice a day with spacer and rinse mouth afterwards.  Demonstrated proper use.  Sample and spacer given. . Prior to physical activity: May use albuterol rescue inhaler 2 puffs 5 to 15 minutes prior to strenuous physical activities. Marland Kitchen Rescue medications: May use albuterol rescue inhaler 2 puffs or nebulizer every 4 to 6 hours as needed for shortness of breath, chest tightness, coughing, and wheezing. Monitor frequency of use.

## 2019-04-18 IMAGING — DX DG LUMBAR SPINE 2-3V
3 series · 3 of 3 positions shown · non-contrast
Comparison: None

CLINICAL DATA: Chronic right low back pain worsening over the last
2 years.

EXAM:
LUMBAR SPINE - 2-3 VIEW

[l-spine ap]
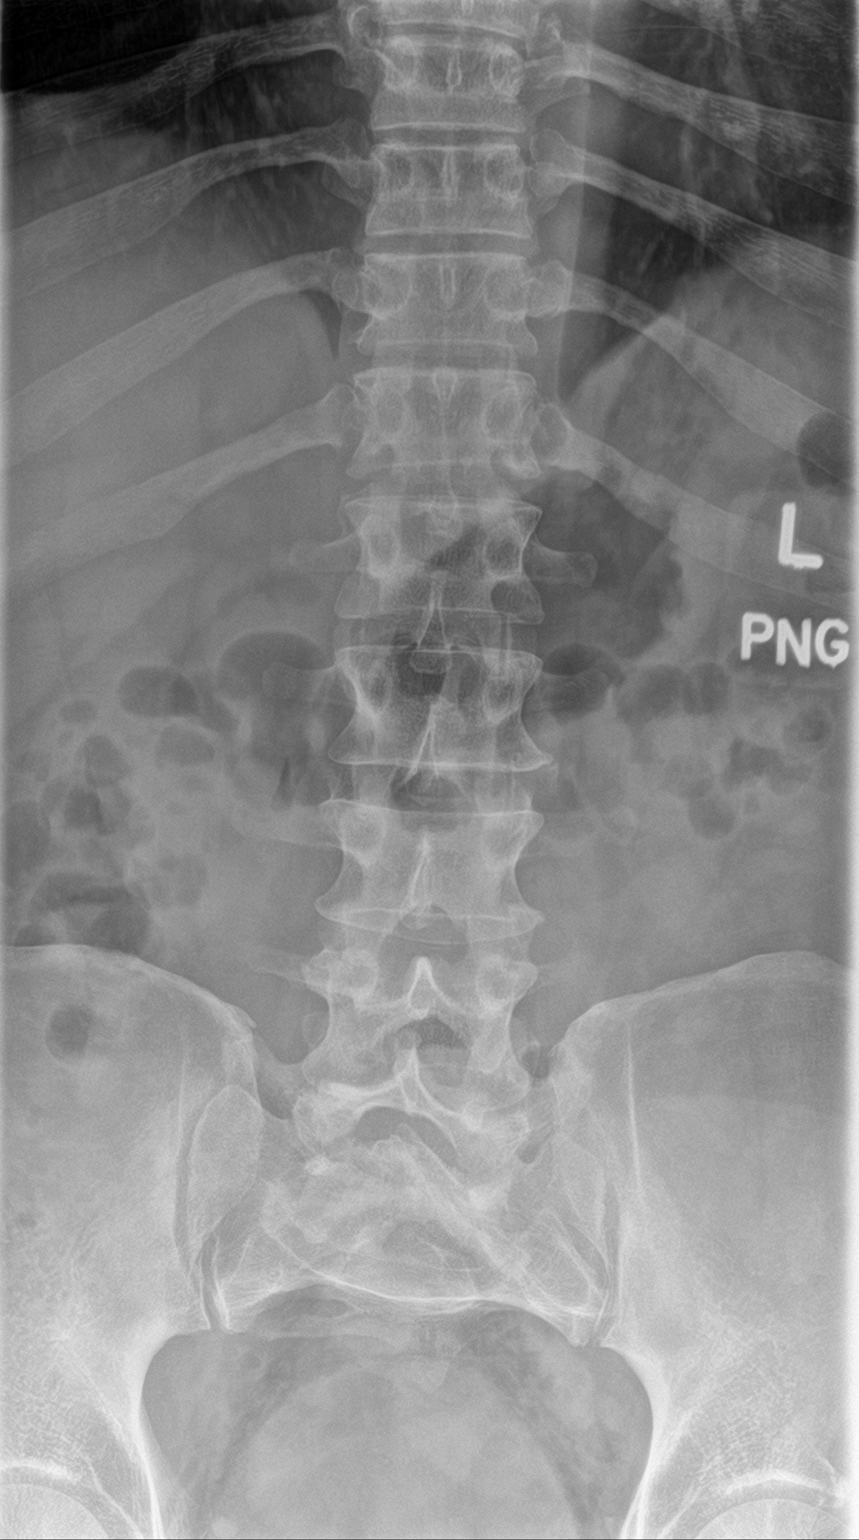

[l-spine lat]
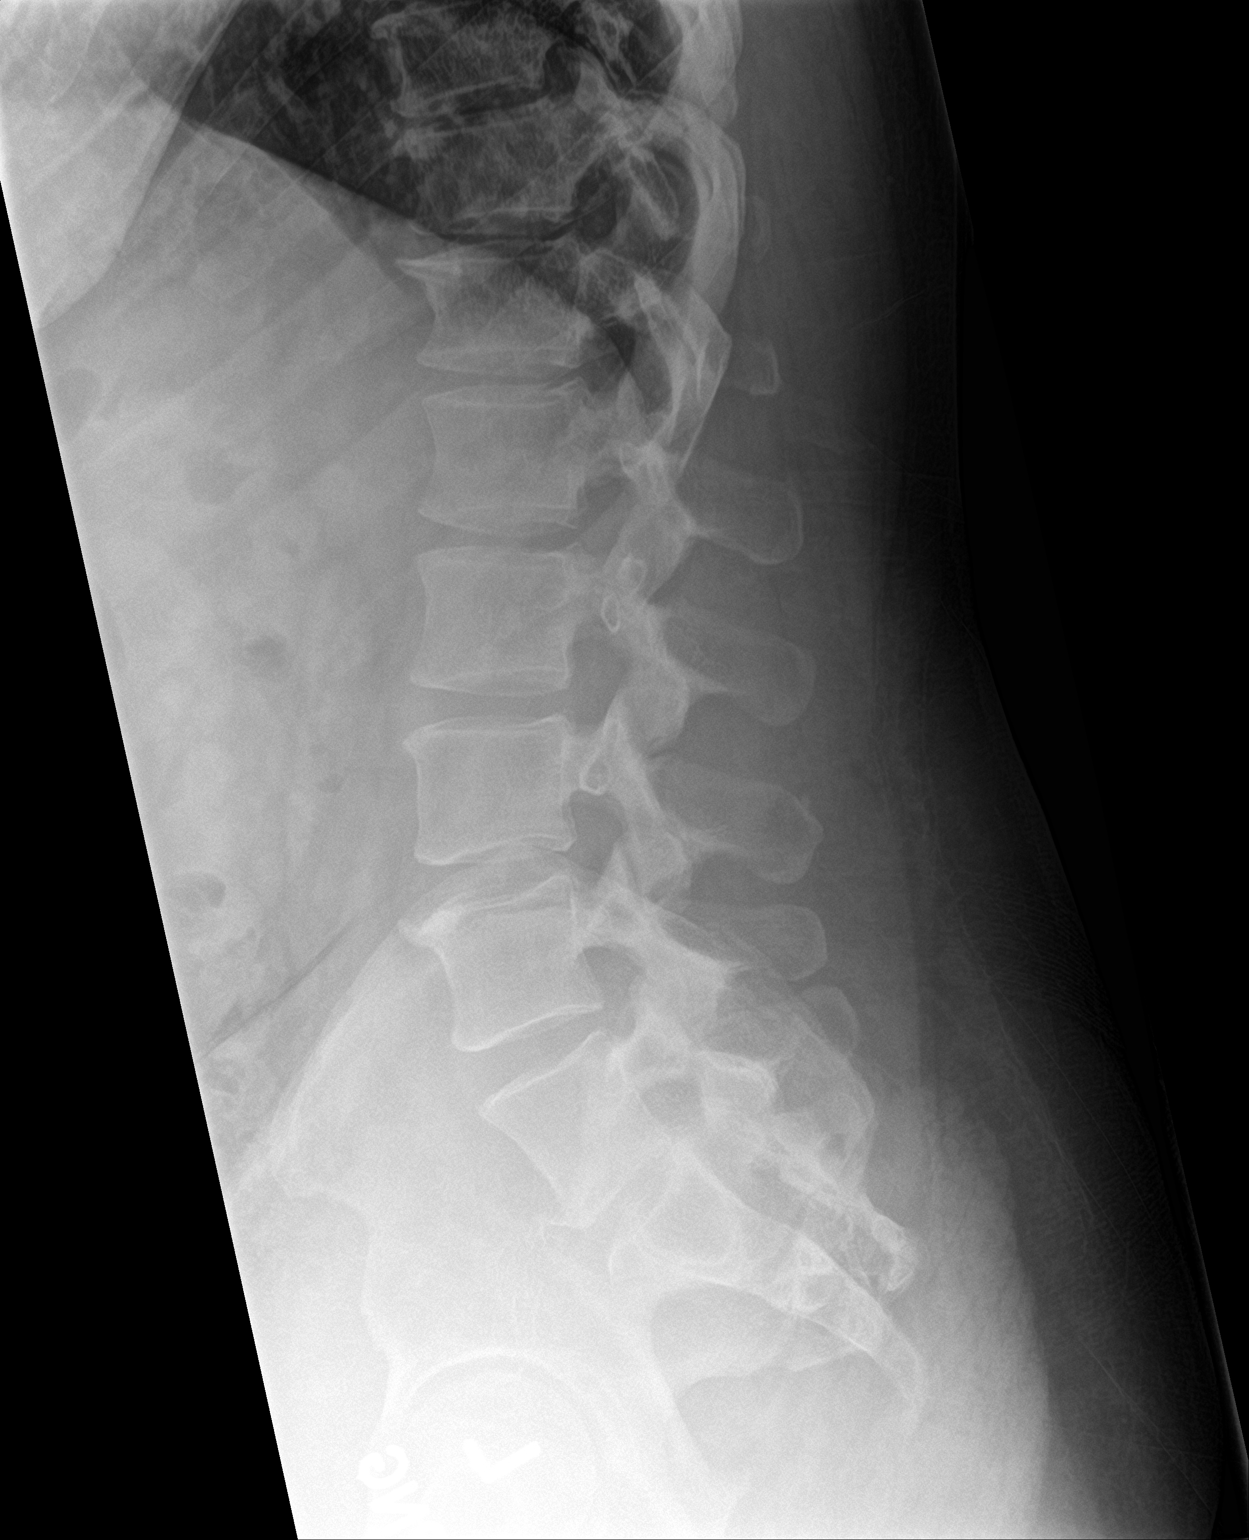

[l-spine spot]
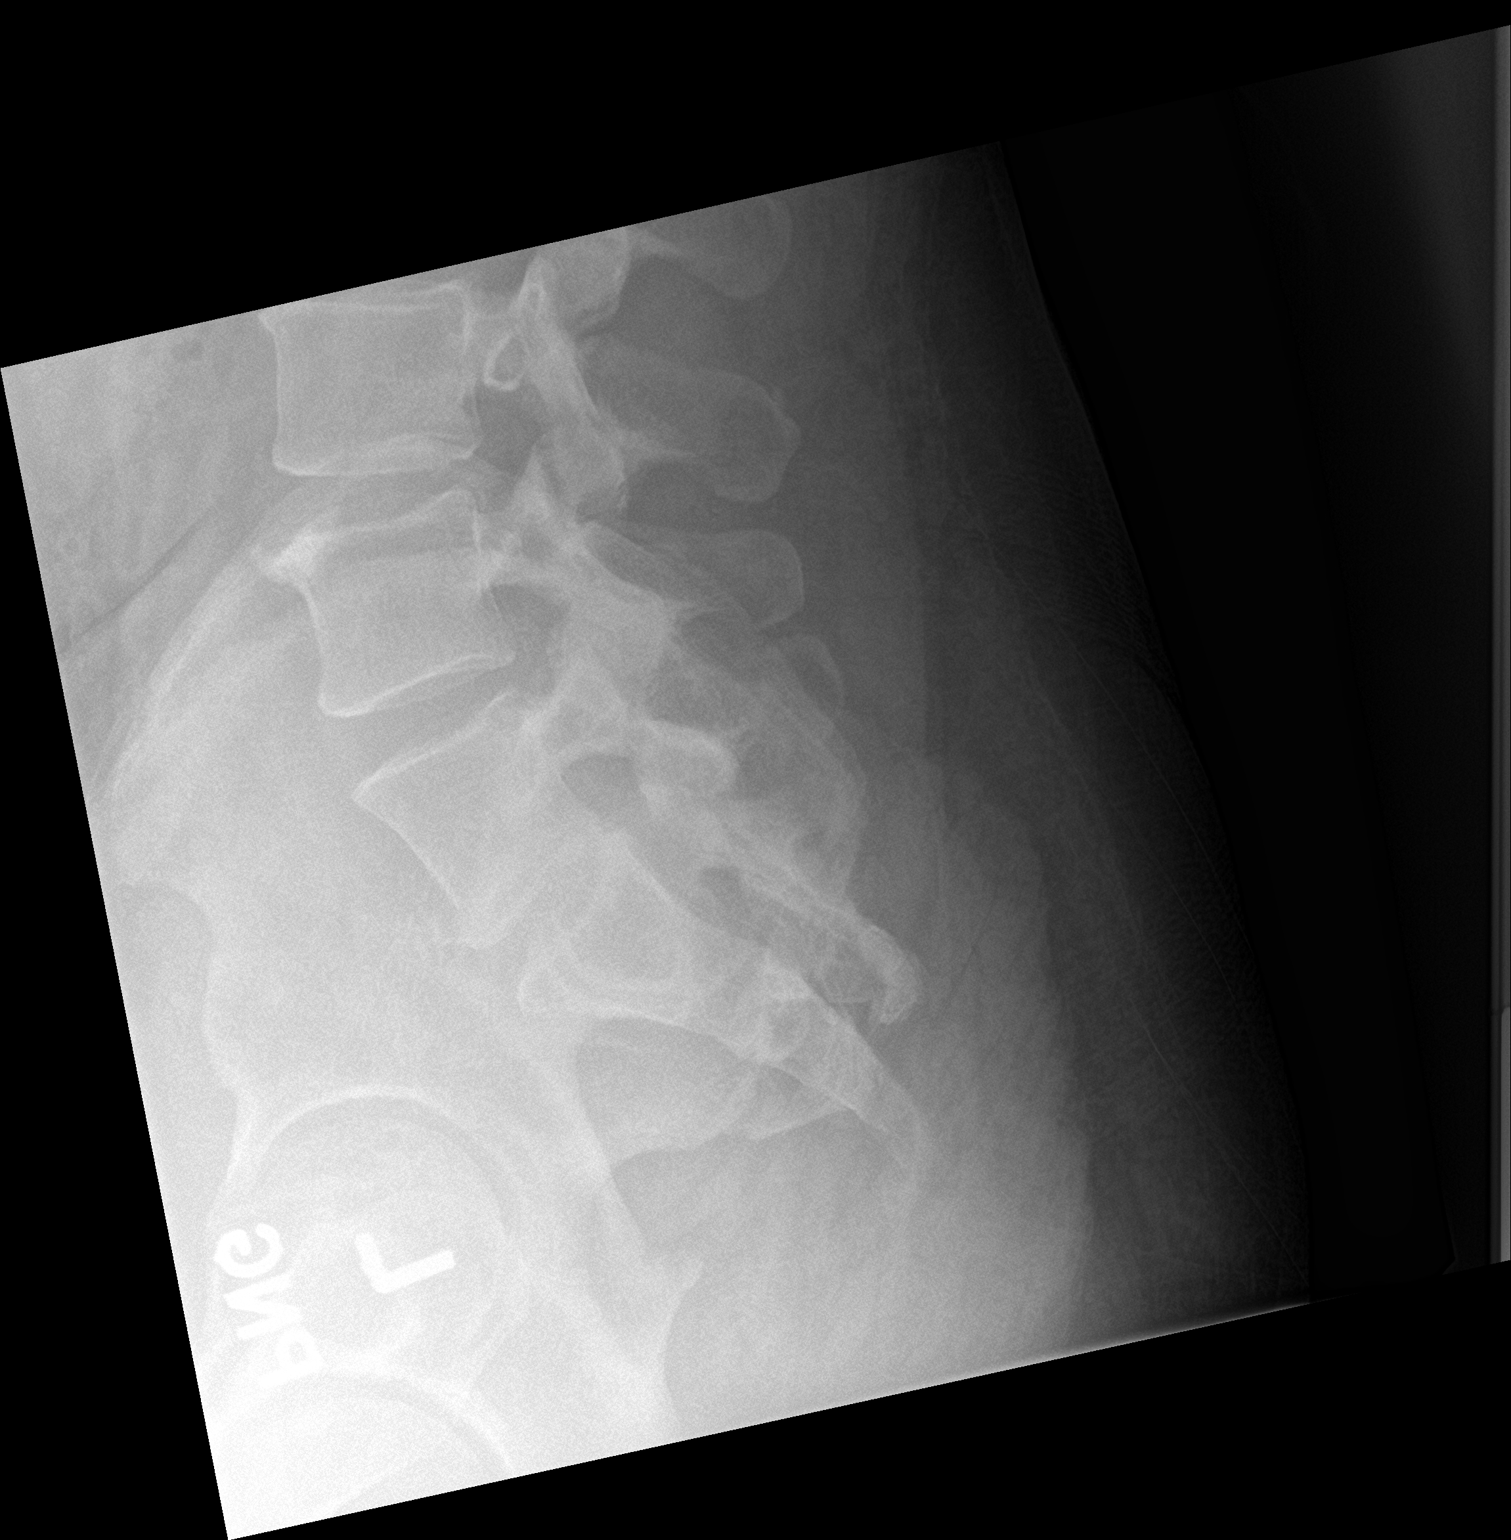

[3 of 3 positions shown; findings below may reference images not displayed]

FINDINGS: Transitional L5 vertebra. Poor visualization of the coccyx with a
somewhat short sacrum. Sacroiliac joints grossly unremarkable.
Anterior spurring along the superior endplates of L4 and T12.
IMPRESSION: 1. Transitional L5 vertebra with poor visualization of the coccyx
and a somewhat short sacrum. This may reflect incidental coccygeal
agenesis as a minimal form of caudal regression, consider lumbar
spine MRI for further characterization of the conus and to assess
for impingement in this patient with history of back pain.

## 2019-04-18 IMAGING — DX DG CERVICAL SPINE 2 OR 3 VIEWS
4 series · 4 of 4 positions shown · non-contrast
Comparison: None.

CLINICAL DATA: Right neck pain and stiffness, worsening over the
last 2 years.

EXAM:
CERVICAL SPINE - 2-3 VIEW

[c-spine lat]
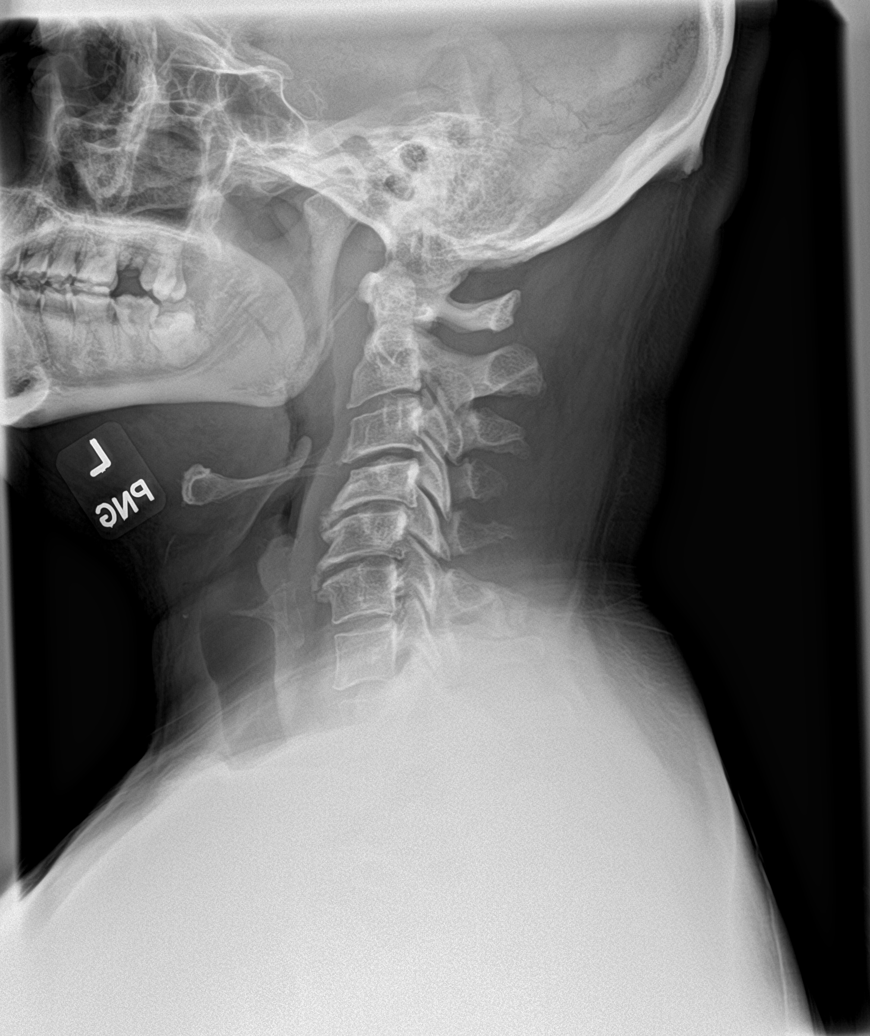

[c-spine ap]
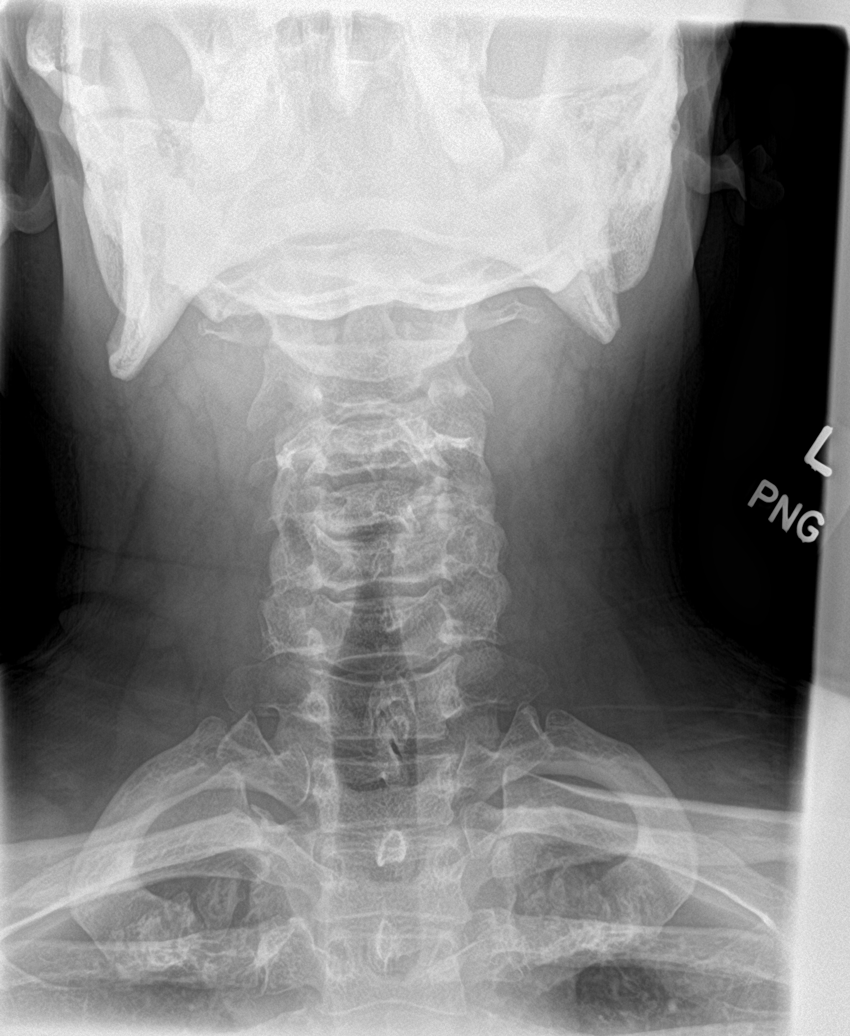

[c-spine open mouth]
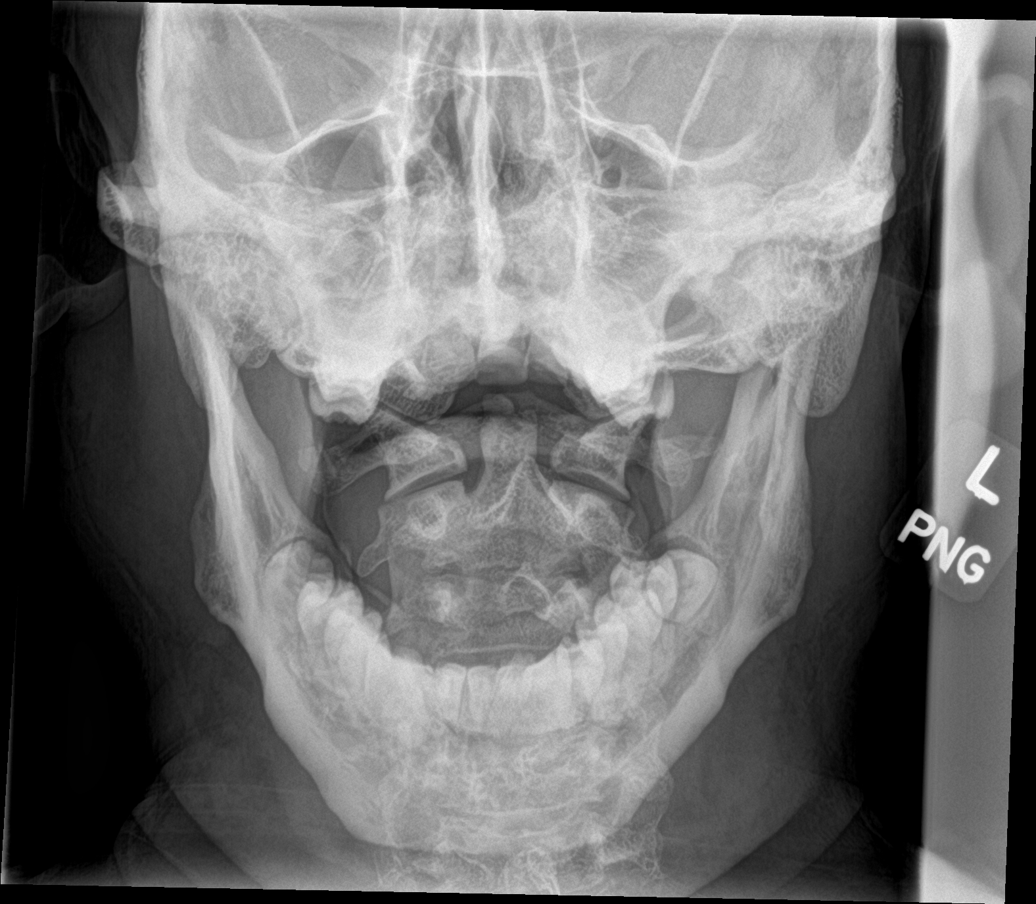

[swimmer]
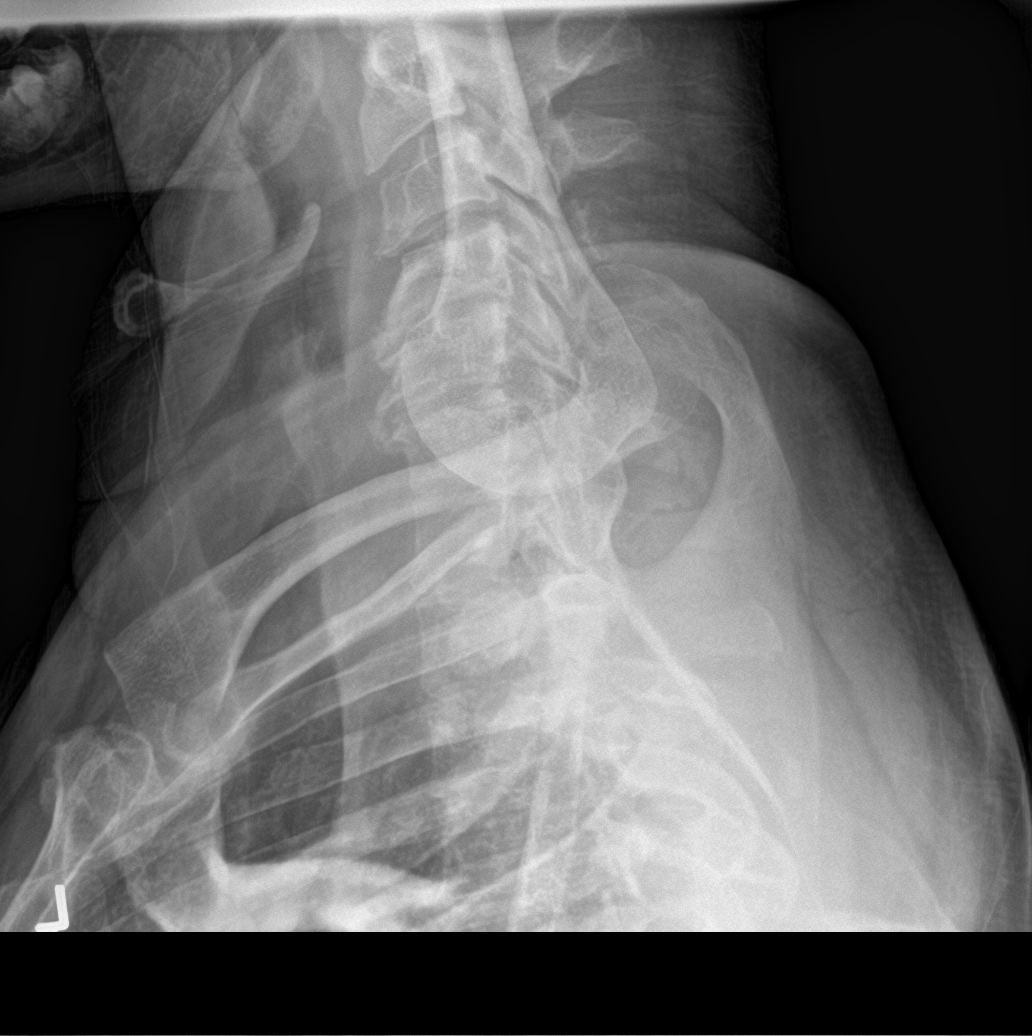

[4 of 4 positions shown; findings below may reference images not displayed]

FINDINGS: 3 mm degenerative retrolisthesis at C4-5 and C5-6 so seated with
loss of intervertebral disc height and intervertebral spurring.
Otherwise alignment appears normal. No prevertebral soft tissue
swelling. C7 spina bifida occulta.
IMPRESSION: 1. Cervical spondylosis and degenerative disc disease with mild
degenerative retrolisthesis at C4-5 and C5-6.
2. Questionable C7 spina bifida occulta.

## 2019-05-29 ENCOUNTER — Encounter: Payer: Self-pay | Admitting: Family Medicine

## 2019-05-29 ENCOUNTER — Ambulatory Visit: Payer: BC Managed Care – PPO | Admitting: Family Medicine

## 2019-05-29 ENCOUNTER — Other Ambulatory Visit: Payer: Self-pay

## 2019-05-29 DIAGNOSIS — L989 Disorder of the skin and subcutaneous tissue, unspecified: Secondary | ICD-10-CM

## 2019-05-29 NOTE — Progress Notes (Signed)
Virtual Visit via Video Note  I connected with William Bowman on 05/29/19 at  9:30 AM EDT by a video enabled telemedicine application and verified that I am speaking with the correct person using two identifiers.  Location patient: home Location provider:work or home office Persons participating in the virtual visit: patient, provider  I discussed the limitations of evaluation and management by telemedicine and the availability of in person appointments. The patient expressed understanding and agreed to proceed.   HPI: Pt is a 43 yo male with pmh sig for anxiety, reactive airway dz, low back pain who noticed a bump on midline neck 3-4 days ago.  Area is non pain, does not itch, is not erythematous.  Pt concerned the area is attached to his thyroid.  Pt denies h/o hair bumps, fever, chills.   ROS: See pertinent positives and negatives per HPI.  Past Medical History:  Diagnosis Date  . Asthma   . Pneumonia     No past surgical history on file.  Family History  Adopted: Yes    SOCIAL HX:    Current Outpatient Medications:  .  albuterol (VENTOLIN HFA) 108 (90 Base) MCG/ACT inhaler, Inhale 2 puffs into the lungs every 4 (four) hours as needed for wheezing or shortness of breath (cough)., Disp: 1 Inhaler, Rfl: 5 .  budesonide-formoterol (SYMBICORT) 80-4.5 MCG/ACT inhaler, Inhale 2 puffs into the lungs 2 (two) times daily., Disp: 1 Inhaler, Rfl: 5 .  cetirizine (ZYRTEC) 10 MG tablet, Take 10 mg by mouth daily as needed for allergies., Disp: , Rfl:  .  Fluticasone Propionate (XHANCE) 93 MCG/ACT EXHU, Place 2 sprays into the nose 2 (two) times a day., Disp: 16 mL, Rfl: 5 .  Fluticasone Propionate (XHANCE) 93 MCG/ACT EXHU, Place 2 sprays into the nose 2 (two) times a day., Disp: 16 mL, Rfl: 5 .  gabapentin (NEURONTIN) 100 MG capsule, Take 2 capsules (200 mg total) by mouth at bedtime., Disp: 60 capsule, Rfl: 3 .  Olopatadine HCl 0.2 % SOLN, Apply 1 drop to eye daily as needed., Disp:  2.5 mL, Rfl: 5 .  triamcinolone cream (KENALOG) 0.1 %, Apply 1 application topically 2 (two) times daily., Disp: 453.6 g, Rfl: 6  EXAM:  VITALS per patient if applicable:  RR between 12-20 bpm  GENERAL: alert, oriented, appears well and in no acute distress  HEENT: atraumatic, conjunctiva clear, no obvious abnormalities on inspection of external nose and ears  NECK: normal movements of the head and neck. A small nodule, 1-1.5cm noted on midline neck below adam's apple.  No drainage or erythema noted.  No other abnormalities noted of neck.  LUNGS: on inspection no signs of respiratory distress, breathing rate appears normal, no obvious gross SOB, gasping or wheezing  CV: no obvious cyanosis  SKIN:  Nodule as noted above on midline low neck.  Pt also with a nodule along R jaw line in beard on inspection.  MS: moves all visible extremities without noticeable abnormality  PSYCH/NEURO: pleasant and cooperative, no obvious depression or anxiety, speech and thought processing grossly intact  ASSESSMENT AND PLAN:  Discussed the following assessment and plan:  Skin lesion of neck  -pt to be seen in office next wk for further eval. -discussed likely a cyst vs ingrown hair, thyroid nodule possible though less likely based on appearance. -given precautions    I discussed the assessment and treatment plan with the patient. The patient was provided an opportunity to ask questions and all were answered. The patient  agreed with the plan and demonstrated an understanding of the instructions.   The patient was advised to call back or seek an in-person evaluation if the symptoms worsen or if the condition fails to improve as anticipated.   Billie Ruddy, MD

## 2019-06-04 ENCOUNTER — Ambulatory Visit: Payer: BC Managed Care – PPO | Admitting: Family Medicine

## 2019-06-16 ENCOUNTER — Encounter: Payer: Self-pay | Admitting: Allergy

## 2019-06-16 ENCOUNTER — Ambulatory Visit (INDEPENDENT_AMBULATORY_CARE_PROVIDER_SITE_OTHER): Payer: BC Managed Care – PPO | Admitting: Allergy

## 2019-06-16 ENCOUNTER — Other Ambulatory Visit: Payer: Self-pay

## 2019-06-16 VITALS — BP 116/78 | HR 90 | Temp 97.6°F | Resp 14 | Ht 67.0 in | Wt 239.6 lb

## 2019-06-16 DIAGNOSIS — H1010 Acute atopic conjunctivitis, unspecified eye: Secondary | ICD-10-CM

## 2019-06-16 DIAGNOSIS — J302 Other seasonal allergic rhinitis: Secondary | ICD-10-CM | POA: Diagnosis not present

## 2019-06-16 DIAGNOSIS — R0602 Shortness of breath: Secondary | ICD-10-CM | POA: Diagnosis not present

## 2019-06-16 DIAGNOSIS — J3089 Other allergic rhinitis: Secondary | ICD-10-CM | POA: Diagnosis not present

## 2019-06-16 NOTE — Patient Instructions (Addendum)
Other allergic rhinitis  2020 skin testing showed: Positive to grass, weed, ragweed, trees, mold, dust mites, dog, cat, cockroach.  Continue environmental control measures.  May use over the counter antihistamines such as Zyrtec (cetirizine), Claritin (loratadine), Allegra (fexofenadine), or Xyzal (levocetirizine) daily as needed.  Continue Xhance 2 sprays twice a day.  Nasal saline spray (i.e., Simply Saline) or nasal saline lavage (i.e., NeilMed) is recommended as needed and prior to medicated nasal sprays.  May use Pazeo 1 drop in each eye daily as needed for itchy/watery eyes.   Get an eye exam.   Shortness of breath  Today's spirometry showed restriction.  Stop Symbicort. If you notice worsening symptoms then may restart and let us know.  Recommend that you continue with your physical therapy.  Follow up with PCP regarding cardiac work up.  Refer for OMT treatment.    Follow up in 3 months

## 2019-06-16 NOTE — Assessment & Plan Note (Signed)
Past history - Perennial rhinoconjunctivitis symptoms for the last 10+ years.  Tried Zyrtec and Flonase as needed with minimal benefit.  2020 skin testing showed: Positive to grass, weed, ragweed, trees, mold, dust mites, dog, cat, cockroach. Interim history - symptoms improved with below regimen.  Continue environmental control measures.  May use over the counter antihistamines such as Zyrtec (cetirizine), Claritin (loratadine), Allegra (fexofenadine), or Xyzal (levocetirizine) daily as needed.  Continue Xhance 2 sprays twice a day.  Nasal saline spray (i.e., Simply Saline) or nasal saline lavage (i.e., NeilMed) is recommended as needed and prior to medicated nasal sprays.  May use Pazeo 1 drop in each eye daily as needed for itchy/watery eyes.   Get an eye exam.

## 2019-06-16 NOTE — Progress Notes (Signed)
Follow Up Note  RE: William Bowman MRN: [id number] DOB: March 02, 1976 Date of Office Visit: 06/16/2019  Referring provider: Billie Ruddy, MD Primary care provider: Billie Ruddy, MD  Chief Complaint: Follow-up (started new medication, does not feel different)  History of Present Illness: I had the pleasure of seeing William Bowman for a follow up visit at the [facility] on 06/16/2019. He is a 43 y.o. male, who is being followed for allergic rhino conjunctivitis and SOB. Today he is here for regular follow up visit. His previous allergy office visit was on 04/16/2019 with Dr. Maudie Mercury.   Allergic rhino conjunctivitis Currently taking zyrtec daily and Xhance 2 sprays BID with good benefit. Only had to use eye drops twice. No eye exam yet.   Shortness of breath Initially he thought the inhaler helped especially with exertion but not sure if it made a difference. He is still having issues with the feeling of not able to breathe deeply only on the right side. He has seen multiple physicians for this - lately saw orthopedics, chiropractor, sports medicine.  He is also doing physical therapy.   Per patient he had recent CXR, EKG which were normal.   Assessment and Plan: William Bowman is a 43 y.o. male with: Shortness of breath Past history - Episodes of shortness of breath, chest tightness, coughing and wheezing for the past 44 years.  Patient used to have an albuterol in the past but has not used one for 4 years.  Recently noted excessive shortness of breath with any kind of exertion and inability to exhale fully.  Apparently had recent normal EKG.  No previous cardiac evaluation.  History of pneumonias in the past and there was a questionable COPD diagnosis as well. Last spirometry showed mixed restriction and mild obstruction with no improvement in FEV1 post bronchodilator treatment.  Patient clinically felt unchanged with Xopenex treatment. Interim history - 2 months trial  of Symbicort with no significant improvement.  Today's spirometry was essentially unchanged from previous one still showing moderate restriction.  Main complaint is inability to breathe fully only on the right side. Evaluated by orthopedics and chiropractor in the past.  Given clinical symptoms concerned from some type of musculoskeletal issue contributing to his breathing complaints.   Stop Symbicor as it was ineffective. If you notice worsening symptoms then may restart and let us know.  Recommend that you continue with your physical therapy.  Follow up with PCP regarding cardiac work up.  Refer for OMT treatment.   If above regimen does not improve symptoms then will consider obtaining full body PFT.  Seasonal and perennial allergic rhinoconjunctivitis Past history - Perennial rhinoconjunctivitis symptoms for the last 10+ years.  Tried Zyrtec and Flonase as needed with minimal benefit.  2020 skin testing showed: Positive to grass, weed, ragweed, trees, mold, dust mites, dog, cat, cockroach. Interim history - symptoms improved with below regimen.  Continue environmental control measures.  May use over the counter antihistamines such as Zyrtec (cetirizine), Claritin (loratadine), Allegra (fexofenadine), or Xyzal (levocetirizine) daily as needed.  Continue Xhance 2 sprays twice a day.  Nasal saline spray (i.e., Simply Saline) or nasal saline lavage (i.e., NeilMed) is recommended as needed and prior to medicated nasal sprays.  May use Pazeo 1 drop in each eye daily as needed for itchy/watery eyes.   Get an eye exam.   Return in about 3 months (around 09/16/2019).  Diagnostics: Spirometry:  Tracings reviewed. His effort: Good reproducible efforts. FVC:  2.30L FEV1: 1.64L, 52% predicted FEV1/FVC ratio: 71% Interpretation: Spirometry consistent with restrictive disease.  Please see scanned spirometry results for details.  Medication List:  Current Outpatient Medications   Medication Sig Dispense Refill  . albuterol (VENTOLIN HFA) 108 (90 Base) MCG/ACT inhaler Inhale 2 puffs into the lungs every 4 (four) hours as needed for wheezing or shortness of breath (cough). 1 Inhaler 5  . cetirizine (ZYRTEC) 10 MG tablet Take 10 mg by mouth daily as needed for allergies.    . Fluticasone Propionate (XHANCE) 93 MCG/ACT EXHU Place 2 sprays into the nose 2 (two) times a day. 16 mL 5  . Fluticasone Propionate (XHANCE) 93 MCG/ACT EXHU Place 2 sprays into the nose 2 (two) times a day. 16 mL 5  . gabapentin (NEURONTIN) 100 MG capsule Take 2 capsules (200 mg total) by mouth at bedtime. 60 capsule 3  . Olopatadine HCl 0.2 % SOLN Apply 1 drop to eye daily as needed. 2.5 mL 5  . triamcinolone cream (KENALOG) 0.1 % Apply 1 application topically 2 (two) times daily. 453.6 g 6   No current facility-administered medications for this visit.    Allergies: No Known Allergies I reviewed his past medical history, social history, family history, and environmental history and no significant changes have been reported from previous visit on 04/16/2019.  Review of Systems  Constitutional: Negative for appetite change, chills, fever and unexpected weight change.  HENT: Negative for congestion, rhinorrhea and sneezing.   Eyes: Negative for discharge and itching.  Respiratory: Positive for chest tightness and shortness of breath. Negative for cough and wheezing.   Cardiovascular: Negative for chest pain.  Gastrointestinal: Negative for abdominal pain.  Genitourinary: Negative for difficulty urinating.  Skin: Negative for rash.  Allergic/Immunologic: Positive for environmental allergies. Negative for food allergies.  Neurological: Negative for headaches.   Objective: BP 116/78 (BP Location: Right Arm, Patient Position: Sitting)   Pulse 90   Temp 97.6 F (36.4 C)   Resp 14   Ht 5\' 7"  (1.702 m)   Wt 239 lb 9.6 oz (108.7 kg)   SpO2 96%   BMI 37.53 kg/m  Body mass index is 37.53 kg/m.  Physical Exam  Constitutional: He is oriented to person, place, and time. He appears well-developed and well-nourished.  HENT:  Head: Normocephalic and atraumatic.  Right Ear: External ear normal.  Left Ear: External ear normal.  Mouth/Throat: Oropharynx is clear and moist.  Eyes: Conjunctivae and EOM are normal.  Neck: Neck supple.  Cardiovascular: Normal rate, regular rhythm and normal heart sounds. Exam reveals no gallop and no friction rub.  No murmur heard. Pulmonary/Chest: Effort normal and breath sounds normal. He has no wheezes. He has no rales.  Abdominal: Soft.  Neurological: He is alert and oriented to person, place, and time.  Skin: Skin is warm. No rash noted.  Psychiatric: He has a normal mood and affect. His behavior is normal.  Nursing note and vitals reviewed.  Previous notes and tests were reviewed. The plan was reviewed with the patient/family, and all questions/concerned were addressed.  It was my pleasure to see William Bowman today and participate in his care. Please feel free to contact me with any questions or concerns.  Sincerely,  Rexene Alberts, DO Allergy & Immunology  [facility] office: [phone] [facility] office: [phone]

## 2019-06-16 NOTE — Assessment & Plan Note (Addendum)
Past history - Episodes of shortness of breath, chest tightness, coughing and wheezing for the past 44 years.  Patient used to have an albuterol in the past but has not used one for 4 years.  Recently noted excessive shortness of breath with any kind of exertion and inability to exhale fully.  Apparently had recent normal EKG.  No previous cardiac evaluation.  History of pneumonias in the past and there was a questionable COPD diagnosis as well. Last spirometry showed mixed restriction and mild obstruction with no improvement in FEV1 post bronchodilator treatment.  Patient clinically felt unchanged with Xopenex treatment. Interim history - 2 months trial of Symbicort with no significant improvement.  Today's spirometry was essentially unchanged from previous one still showing moderate restriction.  Main complaint is inability to breathe fully only on the right side. Evaluated by orthopedics and chiropractor in the past.  Given clinical symptoms concerned from some type of musculoskeletal issue contributing to his breathing complaints.   Stop Symbicor as it was ineffective. If you notice worsening symptoms then may restart and let us know.  Recommend that you continue with your physical therapy.  Follow up with PCP regarding cardiac work up.  Refer for OMT treatment.   If above regimen does not improve symptoms then will consider obtaining full body PFT.

## 2019-06-25 ENCOUNTER — Telehealth: Payer: Self-pay

## 2019-06-25 ENCOUNTER — Ambulatory Visit: Payer: BC Managed Care – PPO | Admitting: Family Medicine

## 2019-06-25 NOTE — Telephone Encounter (Signed)
Called pt left a message for pt to call office and reschedule    Copied from [place] [id number]. Topic: Appointment Scheduling - Scheduling Inquiry for Clinic >> Jun 24, 2019  2:33 PM Sheran Luz wrote: Patient calling to cancel appointment on 7/1. He is not interested in a virtual visit at this time. He states he will call back to reschedule. Attempted to reach office x3.

## 2019-07-31 ENCOUNTER — Ambulatory Visit (INDEPENDENT_AMBULATORY_CARE_PROVIDER_SITE_OTHER): Payer: BC Managed Care – PPO | Admitting: Family Medicine

## 2019-07-31 ENCOUNTER — Encounter: Payer: Self-pay | Admitting: Family Medicine

## 2019-07-31 ENCOUNTER — Other Ambulatory Visit: Payer: Self-pay

## 2019-07-31 DIAGNOSIS — B351 Tinea unguium: Secondary | ICD-10-CM | POA: Diagnosis not present

## 2019-07-31 DIAGNOSIS — L2082 Flexural eczema: Secondary | ICD-10-CM

## 2019-07-31 MED ORDER — TRIAMCINOLONE ACETONIDE 0.5 % EX OINT: 1.0000 "application " | TOPICAL_OINTMENT | Freq: Two times a day (BID) | CUTANEOUS | 7 refills | Status: DC

## 2019-07-31 NOTE — Progress Notes (Signed)
Virtual Visit via Video Note  I connected with William Bowman on 07/31/19 at 10:30 AM EDT by a video enabled telemedicine application 2/2 OVFIE-33 pandemic and verified that I am speaking with the correct person using two identifiers.  Location patient: home Location provider:work or home office Persons participating in the virtual visit: patient, provider  I discussed the limitations of evaluation and management by telemedicine and the availability of in person appointments. The patient expressed understanding and agreed to proceed.   HPI: Pt states has toenail fungus x 2 yrs.  Tried numerous OTC meds in the past.  Was seen by Derm, Dr. Sherral Hammers, years ago and started on Lamisil po which helped.  Pt interested in restarting med.  Eczema on L arm.  Was using triamcinalone 0.5% cream.  States had a difficult time getting the ointment.  Using kerry's shea butter lotion.  Does not take hot showers.  Does note showering a few times per day.  ROS: See pertinent positives and negatives per HPI.  Past Medical History:  Diagnosis Date  . Asthma   . Eczema   . Pneumonia     No past surgical history on file.  Family History  Adopted: Yes     Current Outpatient Medications:  .  albuterol (VENTOLIN HFA) 108 (90 Base) MCG/ACT inhaler, Inhale 2 puffs into the lungs every 4 (four) hours as needed for wheezing or shortness of breath (cough)., Disp: 1 Inhaler, Rfl: 5 .  cetirizine (ZYRTEC) 10 MG tablet, Take 10 mg by mouth daily as needed for allergies., Disp: , Rfl:  .  Fluticasone Propionate (XHANCE) 93 MCG/ACT EXHU, Place 2 sprays into the nose 2 (two) times a day., Disp: 16 mL, Rfl: 5 .  Fluticasone Propionate (XHANCE) 93 MCG/ACT EXHU, Place 2 sprays into the nose 2 (two) times a day., Disp: 16 mL, Rfl: 5 .  gabapentin (NEURONTIN) 100 MG capsule, Take 2 capsules (200 mg total) by mouth at bedtime., Disp: 60 capsule, Rfl: 3 .  Olopatadine HCl 0.2 % SOLN, Apply 1 drop to eye daily as needed.,  Disp: 2.5 mL, Rfl: 5 .  triamcinolone cream (KENALOG) 0.1 %, Apply 1 application topically 2 (two) times daily., Disp: 453.6 g, Rfl: 6  EXAM:  VITALS per patient if applicable:  RR between 12-20 bpm  GENERAL: alert, oriented, appears well and in no acute distress  HEENT: atraumatic, conjunctiva clear, no obvious abnormalities on inspection of external nose and ears  NECK: normal movements of the head and neck  LUNGS: on inspection no signs of respiratory distress, breathing rate appears normal, no obvious gross SOB, gasping or wheezing  CV: no obvious cyanosis  MS: moves all visible extremities without noticeable abnormality  SKIN: thickened, dark, discolored onychomycotic toe nails of b/l feet.  Area of dry, hypertrophic, skin on L arm.  PSYCH/NEURO: pleasant and cooperative, no obvious depression or anxiety, speech and thought processing grossly intact  ASSESSMENT AND PLAN:  Discussed the following assessment and plan:  Onychomycosis -will obtain labs -if LFTs normal will start po antifungal medication -discussed r/b/a and likely duration of treatment. - Plan: Comprehensive metabolic panel, CBC (no diff)  Flexural eczema  - Plan: triamcinolone ointment (KENALOG) 0.5 %  F/u prn   I discussed the assessment and treatment plan with the patient. The patient was provided an opportunity to ask questions and all were answered. The patient agreed with the plan and demonstrated an understanding of the instructions.   The patient was advised to call back or  seek an in-person evaluation if the symptoms worsen or if the condition fails to improve as anticipated.   Billie Ruddy, MD

## 2019-08-12 ENCOUNTER — Telehealth: Payer: Self-pay | Admitting: *Deleted

## 2019-08-12 NOTE — Telephone Encounter (Signed)
Copied from [place] [id number]. Topic: General - Other >> Aug 12, 2019  8:31 AM Leward Quan A wrote: Reason for CRM: Patient called to ask that the order for blood work from Dr banks is sent to [facility] on [address] per patient he does not have a car to get to the office at this time so please send orders to the [facility] Ph# [phone] and call patient when order is sent Ph# [phone]

## 2019-08-15 ENCOUNTER — Other Ambulatory Visit: Payer: Self-pay | Admitting: Allergy

## 2019-08-15 NOTE — Telephone Encounter (Signed)
Pt called requesting an update on getting the lab orders switched over. Please advise.

## 2019-08-15 NOTE — Telephone Encounter (Signed)
See message.

## 2019-08-18 ENCOUNTER — Other Ambulatory Visit: Payer: Self-pay | Admitting: Family Medicine

## 2019-08-18 ENCOUNTER — Other Ambulatory Visit: Payer: BC Managed Care – PPO

## 2019-08-18 ENCOUNTER — Other Ambulatory Visit: Payer: Self-pay

## 2019-08-18 DIAGNOSIS — B351 Tinea unguium: Secondary | ICD-10-CM

## 2019-08-18 NOTE — Telephone Encounter (Signed)
Pt lab orders have been faxed to [facility] per pt request

## 2019-08-19 DIAGNOSIS — B351 Tinea unguium: Secondary | ICD-10-CM | POA: Diagnosis not present

## 2019-08-20 LAB — CBC WITH DIFFERENTIAL/PLATELET
Basophils Absolute: 0 10*3/uL (ref 0.0–0.2)
Basos: 0 %
EOS (ABSOLUTE): 0.1 10*3/uL (ref 0.0–0.4)
Eos: 2 %
Hematocrit: 39.9 % (ref 37.5–51.0)
Hemoglobin: 13.9 g/dL (ref 13.0–17.7)
Immature Grans (Abs): 0 10*3/uL (ref 0.0–0.1)
Immature Granulocytes: 0 %
Lymphocytes Absolute: 1.9 10*3/uL (ref 0.7–3.1)
Lymphs: 28 %
MCH: 32.9 pg (ref 26.6–33.0)
MCHC: 34.8 g/dL (ref 31.5–35.7)
MCV: 94 fL (ref 79–97)
Monocytes Absolute: 0.5 10*3/uL (ref 0.1–0.9)
Monocytes: 7 %
Neutrophils Absolute: 4.3 10*3/uL (ref 1.4–7.0)
Neutrophils: 63 %
Platelets: 219 10*3/uL (ref 150–450)
RBC: 4.23 x10E6/uL (ref 4.14–5.80)
RDW: 13 % (ref 11.6–15.4)
WBC: 6.8 10*3/uL (ref 3.4–10.8)

## 2019-08-20 LAB — COMPREHENSIVE METABOLIC PANEL
ALT: 19 IU/L (ref 0–44)
AST: 19 IU/L (ref 0–40)
Albumin/Globulin Ratio: 1.9 (ref 1.2–2.2)
Albumin: 4.4 g/dL (ref 4.0–5.0)
Alkaline Phosphatase: 48 IU/L (ref 39–117)
BUN/Creatinine Ratio: 18 (ref 9–20)
BUN: 13 mg/dL (ref 6–24)
Bilirubin Total: 0.3 mg/dL (ref 0.0–1.2)
CO2: 21 mmol/L (ref 20–29)
Calcium: 9.1 mg/dL (ref 8.7–10.2)
Chloride: 102 mmol/L (ref 96–106)
Creatinine, Ser: 0.74 mg/dL — ABNORMAL LOW (ref 0.76–1.27)
GFR calc Af Amer: 130 mL/min/{1.73_m2} (ref >59)
GFR calc non Af Amer: 112 mL/min/{1.73_m2} (ref >59)
Globulin, Total: 2.3 g/dL (ref 1.5–4.5)
Glucose: 104 mg/dL — ABNORMAL HIGH (ref 65–99)
Potassium: 4.1 mmol/L (ref 3.5–5.2)
Sodium: 137 mmol/L (ref 134–144)
Total Protein: 6.7 g/dL (ref 6.0–8.5)

## 2019-08-22 ENCOUNTER — Telehealth: Payer: Self-pay | Admitting: Family Medicine

## 2019-08-22 NOTE — Telephone Encounter (Signed)
Medication Refill - Medication: something for toe nail fungus  Has the patient contacted their pharmacy? Yes.   (Agent: If no, request that the patient contact the pharmacy for the refill.) (Agent: If yes, when and what did the pharmacy advise?)  Preferred Pharmacy (with phone number or street name): [facility] [address]   Pt says Dr Volanda Napoleon was supposed to look at labs and then send in rx for toe nail fungus.  He got the labs but didn't get the rx yet.    Agent: Please be advised that RX refills may take up to 3 business days. We ask that you follow-up with your pharmacy.

## 2019-08-22 NOTE — Telephone Encounter (Signed)
Please see request

## 2019-08-22 NOTE — Telephone Encounter (Signed)
Please advise 

## 2019-08-25 ENCOUNTER — Other Ambulatory Visit: Payer: Self-pay | Admitting: Family Medicine

## 2019-08-25 DIAGNOSIS — B351 Tinea unguium: Secondary | ICD-10-CM

## 2019-08-25 MED ORDER — TERBINAFINE HCL 250 MG PO TABS: 250.0000 mg | ORAL_TABLET | Freq: Every day | ORAL | 3 refills | Status: DC

## 2019-08-25 NOTE — Telephone Encounter (Signed)
rx sent.  Pt needs repeat CMP in 1 month.

## 2019-08-29 NOTE — Telephone Encounter (Signed)
LVM for pt advised pt to call the office and schedule a lab appointment for a CMP recheck

## 2019-09-17 ENCOUNTER — Ambulatory Visit: Payer: BC Managed Care – PPO | Admitting: Allergy

## 2019-10-13 ENCOUNTER — Ambulatory Visit: Payer: BC Managed Care – PPO | Admitting: Allergy

## 2019-10-13 ENCOUNTER — Other Ambulatory Visit: Payer: Self-pay

## 2019-10-13 ENCOUNTER — Emergency Department (HOSPITAL_COMMUNITY)
Admission: EM | Admit: 2019-10-13 | Discharge: 2019-10-13 | Disposition: A | Discharge status: Home / Self Care | Payer: BC Managed Care – PPO | Attending: Emergency Medicine | Admitting: Emergency Medicine

## 2019-10-13 ENCOUNTER — Emergency Department (HOSPITAL_COMMUNITY): Payer: BC Managed Care – PPO

## 2019-10-13 DIAGNOSIS — J45909 Unspecified asthma, uncomplicated: Secondary | ICD-10-CM | POA: Diagnosis not present

## 2019-10-13 DIAGNOSIS — Z87891 Personal history of nicotine dependence: Secondary | ICD-10-CM | POA: Insufficient documentation

## 2019-10-13 DIAGNOSIS — R0789 Other chest pain: Secondary | ICD-10-CM | POA: Diagnosis not present

## 2019-10-13 DIAGNOSIS — Z79899 Other long term (current) drug therapy: Secondary | ICD-10-CM | POA: Diagnosis not present

## 2019-10-13 DIAGNOSIS — R079 Chest pain, unspecified: Secondary | ICD-10-CM | POA: Diagnosis not present

## 2019-10-13 MED ORDER — METHOCARBAMOL 500 MG PO TABS: 1000.0000 mg | ORAL_TABLET | Freq: Four times a day (QID) | ORAL | 0 refills | Status: DC

## 2019-10-13 MED ORDER — NAPROXEN 500 MG PO TABS: 500.0000 mg | ORAL_TABLET | Freq: Two times a day (BID) | ORAL | 0 refills | Status: DC

## 2019-10-13 MED ORDER — KETOROLAC TROMETHAMINE 15 MG/ML IJ SOLN
15.0000 mg | Freq: Once | INTRAMUSCULAR | Status: AC
  Administered 2019-10-13: 15 mg via INTRAMUSCULAR
  Filled 2019-10-13: qty 1

## 2019-10-13 MED ORDER — METHOCARBAMOL 500 MG PO TABS
1000.0000 mg | ORAL_TABLET | Freq: Once | ORAL | Status: AC
  Administered 2019-10-13: 1000 mg via ORAL
  Filled 2019-10-13: qty 2

## 2019-10-13 NOTE — Discharge Instructions (Signed)
Please read and follow all provided instructions.  Your diagnoses today include:  1. Right-sided chest wall pain     Tests performed today include:  An EKG of your heart  A chest x-ray  Vital signs. See below for your results today.   Medications prescribed:   Naproxen - anti-inflammatory pain medication  Do not exceed 500mg  naproxen every 12 hours, take with food  You have been prescribed an anti-inflammatory medication or NSAID. Take with food. Take smallest effective dose for the shortest duration needed for your pain. Stop taking if you experience stomach pain or vomiting.    Robaxin (methocarbamol) - muscle relaxer medication  DO NOT drive or perform any activities that require you to be awake and alert because this medicine can make you drowsy.   Take any prescribed medications only as directed.  Follow-up instructions: Please follow-up with your primary care provider as soon as you can for further evaluation of your symptoms.   Return instructions:  SEEK IMMEDIATE MEDICAL ATTENTION IF:  You have severe chest pain, especially if the pain is crushing or pressure-like and spreads to the arms, back, neck, or jaw, or if you have sweating, nausea (feeling sick to your stomach), or shortness of breath. THIS IS AN EMERGENCY. Don't wait to see if the pain will go away. Get medical help at once. Call 911 or 0 (operator). DO NOT drive yourself to the hospital.   Your chest pain gets worse and does not go away with rest.   You have an attack of chest pain lasting longer than usual, despite rest and treatment with the medications your caregiver has prescribed.   You wake from sleep with chest pain or shortness of breath.  You feel dizzy or faint.  You have chest pain not typical of your usual pain for which you originally saw your caregiver.   You have any other emergent concerns regarding your health.  Additional Information: Chest pain comes from many different causes.  Your caregiver has diagnosed you as having chest pain that is not specific for one problem, but does not require admission.  You are at low risk for an acute heart condition or other serious illness.   Your vital signs today were: BP (!) 130/91    Pulse 79    Temp 98.2 F (36.8 C) (Oral)    Resp (!) 22    SpO2 100%  If your blood pressure (BP) was elevated above 135/85 this visit, please have this repeated by your doctor within one month. --------------

## 2019-10-13 NOTE — Progress Notes (Deleted)
Follow Up Note  RE: William Bowman MRN: [id number] DOB: 08/21/1976 Date of Office Visit: 10/13/2019  Referring provider: Billie Ruddy, MD Primary care provider: Billie Ruddy, MD  Chief Complaint: No chief complaint on file.  History of Present Illness: I had the pleasure of seeing William Bowman for a follow up visit at the [facility] on 10/13/2019. He is a 43 y.o. male, who is being followed for shortness of breath, allergic rhinoconjunctivitis. Today he is here for regular follow up visit. His previous allergy office visit was on 06/16/2019 with Dr. Maudie Mercury.   Shortness of breath Past history - Episodes of shortness of breath, chest tightness, coughing and wheezing for the past 44 years.  Patient used to have an albuterol in the past but has not used one for 4 years.  Recently noted excessive shortness of breath with any kind of exertion and inability to exhale fully.  Apparently had recent normal EKG.  No previous cardiac evaluation.  History of pneumonias in the past and there was a questionable COPD diagnosis as well. Last spirometry showed mixed restriction and mild obstruction with no improvement in FEV1 post bronchodilator treatment.  Patient clinically felt unchanged with Xopenex treatment. Interim history - 2 months trial of Symbicort with no significant improvement.  Today's spirometry was essentially unchanged from previous one still showing moderate restriction.  Main complaint is inability to breathe fully only on the right side. Evaluated by orthopedics and chiropractor in the past.  Given clinical symptoms concerned from some type of musculoskeletal issue contributing to his breathing complaints.   Stop Symbicor as it was ineffective. If you notice worsening symptoms then may restart and let us know.  Recommend that you continue with your physical therapy.  Follow up with PCP regarding cardiac work up.  Refer for OMT treatment.   If above  regimen does not improve symptoms then will consider obtaining full body PFT.  Seasonal and perennial allergic rhinoconjunctivitis Past history - Perennial rhinoconjunctivitis symptoms for the last 10+ years.  Tried Zyrtec and Flonase as needed with minimal benefit.  2020 skin testing showed: Positive to grass, weed, ragweed, trees, mold, dust mites, dog, cat, cockroach. Interim history - symptoms improved with below regimen.  Continueenvironmental control measures.  May use over the counter antihistamines such as Zyrtec (cetirizine), Claritin (loratadine), Allegra (fexofenadine), or Xyzal (levocetirizine) daily as needed.  Continue Xhance 2 sprays twice a day. Nasal saline spray (i.e., Simply Saline) or nasal saline lavage (i.e., NeilMed) is recommended as needed and prior to medicated nasal sprays.  May use Pazeo 1 drop in each eye daily as needed for itchy/watery eyes.   Get an eye exam.  Return in about 3 months (around 09/16/2019).  Assessment and Plan: William Bowman is a 43 y.o. male with: No problem-specific Assessment & Plan notes found for this encounter.  No follow-ups on file.  No orders of the defined types were placed in this encounter.  Lab Orders  No laboratory test(s) ordered today    Diagnostics: Spirometry:  Tracings reviewed. His effort: {Blank single:19197::"Good reproducible efforts.","It was hard to get consistent efforts and there is a question as to whether this reflects a maximal maneuver.","Poor effort, data can not be interpreted."} FVC: ***L FEV1: ***L, ***% predicted FEV1/FVC ratio: ***% Interpretation: {Blank single:19197::"Spirometry consistent with mild obstructive disease","Spirometry consistent with moderate obstructive disease","Spirometry consistent with severe obstructive disease","Spirometry consistent with possible restrictive disease","Spirometry consistent with mixed obstructive and restrictive disease","Spirometry uninterpretable due to  [employer]  consistent with normal pattern","No overt abnormalities noted given today's efforts"}.  Please see scanned spirometry results for details.  Skin Testing: {Blank single:19197::"Select foods","Environmental allergy panel","Environmental allergy panel and select foods","Food allergy panel","None","Deferred due to recent antihistamines use"}. Positive test to: ***. Negative test to: ***.  Results discussed with patient/family.   Medication List:  No current facility-administered medications for this visit.    Current Outpatient Medications  Medication Sig Dispense Refill  . albuterol (VENTOLIN HFA) 108 (90 Base) MCG/ACT inhaler Inhale 2 puffs into the lungs every 4 (four) hours as needed for wheezing or shortness of breath (cough). 1 Inhaler 5  . cetirizine (ZYRTEC) 10 MG tablet Take 10 mg by mouth daily as needed for allergies.    . Fluticasone Propionate (XHANCE) 93 MCG/ACT EXHU Place 2 sprays into the nose 2 (two) times a day. 16 mL 5  . gabapentin (NEURONTIN) 100 MG capsule Take 2 capsules (200 mg total) by mouth at bedtime. 60 capsule 3  . Olopatadine HCl 0.2 % SOLN Apply 1 drop to eye daily as needed. 2.5 mL 5  . terbinafine (LAMISIL) 250 MG tablet Take 1 tablet (250 mg total) by mouth daily. 30 tablet 3  . triamcinolone ointment (KENALOG) 0.5 % Apply 1 application topically 2 (two) times daily. 30 g 7  . XHANCE 93 MCG/ACT EXHU BLOW 2 DOSES IN EACH NOSTRIL TWICE DAILY 16 mL 5   Facility-Administered Medications Ordered in Other Visits  Medication Dose Route Frequency Provider Last Rate Last Dose  . ketorolac (TORADOL) 15 MG/ML injection 15 mg  15 mg Intramuscular Once Geiple, Joshua, PA-C      . methocarbamol (ROBAXIN) tablet 1,000 mg  1,000 mg Oral Once Carlisle Cater, PA-C       Allergies: No Known Allergies I reviewed his past medical history, social history, family history, and environmental history and no significant changes have been reported from his  previous visit.  Review of Systems  Constitutional: Negative for appetite change, chills, fever and unexpected weight change.  HENT: Negative for congestion, rhinorrhea and sneezing.   Eyes: Negative for discharge and itching.  Respiratory: Positive for chest tightness and shortness of breath. Negative for cough and wheezing.   Cardiovascular: Negative for chest pain.  Gastrointestinal: Negative for abdominal pain.  Genitourinary: Negative for difficulty urinating.  Skin: Negative for rash.  Allergic/Immunologic: Positive for environmental allergies. Negative for food allergies.  Neurological: Negative for headaches.   Objective: There were no vitals taken for this visit. There is no height or weight on file to calculate BMI. Physical Exam  Constitutional: He is oriented to person, place, and time. He appears well-developed and well-nourished.  HENT:  Head: Normocephalic and atraumatic.  Right Ear: External ear normal.  Left Ear: External ear normal.  Mouth/Throat: Oropharynx is clear and moist.  Eyes: Conjunctivae and EOM are normal.  Neck: Neck supple.  Cardiovascular: Normal rate, regular rhythm and normal heart sounds. Exam reveals no gallop and no friction rub.  No murmur heard. Pulmonary/Chest: Effort normal and breath sounds normal. He has no wheezes. He has no rales.  Abdominal: Soft.  Neurological: He is alert and oriented to person, place, and time.  Skin: Skin is warm. No rash noted.  Psychiatric: He has a normal mood and affect. His behavior is normal.  Nursing note and vitals reviewed.  Previous notes and tests were reviewed. The plan was reviewed with the patient/family, and all questions/concerned were addressed.  It was my pleasure to see William Bowman today and participate in  his care. Please feel free to contact me with any questions or concerns.  Sincerely,  Rexene Alberts, DO Allergy & Immunology  [facility] office:  [phone] [facility] office: [place] office: [phone]

## 2019-10-13 NOTE — ED Triage Notes (Signed)
Pt reports sudden onset yesterday afternoon of right side rib and back pain. Increases with breathing and movement.

## 2019-10-13 NOTE — ED Provider Notes (Signed)
[facility] EMERGENCY DEPARTMENT Provider Note   CSN: [id number] Arrival date & time: 10/13/19  [id number]     History   Chief Complaint Chief Complaint  Patient presents with  . Rib Injury    HPI William Bowman is a 43 y.o. male.     Patient with history of asthma presents to the emergency department today with complaint of right-sided rib cage and chest pain.  Patient states that the symptoms started while at work yesterday afternoon.  He works as a Biomedical scientist.  He denies any acute injuries.  States that he "exhaled" and everything started hurting.  Pain is worse with movement, deep breathing.  He states that he feels short of breath but because he is very anxious.  He denies any nausea or vomiting.  No cough or fever.  No exertional pain, diaphoresis.  Patient denies risk factors for pulmonary embolism including: unilateral leg swelling, history of DVT/PE/other blood clots, use of exogenous hormones, recent immobilizations, recent surgery, recent travel (>4hr segment), malignancy, hemoptysis.  History of tobacco use.  No history of hypertension, diabetes, high cholesterol.       Past Medical History:  Diagnosis Date  . Asthma   . Eczema   . Pneumonia     Patient Active Problem List   Diagnosis Date Noted  . Seasonal and perennial allergic rhinoconjunctivitis 06/16/2019  . Other allergic rhinitis 04/16/2019  . Shortness of breath 04/16/2019  . Allergic conjunctivitis of both eyes 04/16/2019  . Reactive airway disease 04/16/2019  . Anxiety 01/21/2019  . Bursitis of right shoulder 06/05/2018  . Polyarthralgia 04/15/2018  . 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 Low back pain with right-sided sciatica 04/15/2018    No past surgical history on file.      Home Medications    Prior to Admission medications   Medication Sig Start Date End Date Taking? Authorizing Provider  albuterol (VENTOLIN HFA) 108 (90 Base) MCG/ACT inhaler Inhale 2 puffs into  the lungs every 4 (four) hours as needed for wheezing or shortness of breath (cough). 04/16/19   Garnet Sierras, DO  cetirizine (ZYRTEC) 10 MG tablet Take 10 mg by mouth daily as needed for allergies.    [provider]  Fluticasone Propionate (XHANCE) 93 MCG/ACT EXHU Place 2 sprays into the nose 2 (two) times a day. 04/16/19   Garnet Sierras, DO  gabapentin (NEURONTIN) 100 MG capsule Take 2 capsules (200 mg total) by mouth at bedtime. 04/15/18   Lyndal Pulley, DO  Olopatadine HCl 0.2 % SOLN Apply 1 drop to eye daily as needed. 04/16/19   Garnet Sierras, DO  terbinafine (LAMISIL) 250 MG tablet Take 1 tablet (250 mg total) by mouth daily. 08/25/19   Billie Ruddy, MD  triamcinolone ointment (KENALOG) 0.5 % Apply 1 application topically 2 (two) times daily. 07/31/19   Billie Ruddy, MD  Truett Perna 52 MCG/ACT EXHU BLOW 2 DOSES IN EACH NOSTRIL TWICE DAILY 08/15/19   Garnet Sierras, DO    Family History Family History  Adopted: Yes    Social History Social History   Tobacco Use  . Smoking status: Former Smoker    Years: 0.50    Types: Cigarettes  . Smokeless tobacco: Never Used  Substance Use Topics  . Alcohol use: Yes    Comment: ocassionally beer and/or liquor  . Drug use: Yes    Frequency: 2.0 times per week    Types: Marijuana     Allergies   Patient  has no known allergies.   Review of Systems Review of Systems  Constitutional: Negative for diaphoresis and fever.  Eyes: Negative for redness.  Respiratory: Positive for shortness of breath (anxiety). Negative for cough.   Cardiovascular: Positive for chest pain. Negative for palpitations and leg swelling.  Gastrointestinal: Negative for abdominal pain, nausea and vomiting.  Genitourinary: Negative for dysuria.  Musculoskeletal: Negative for back pain and neck pain.  Skin: Negative for rash.  Neurological: Negative for syncope and light-headedness.  Psychiatric/Behavioral: The patient is nervous/anxious.      Physical Exam  Updated Vital Signs BP 121/76   Pulse (!) 108   Temp 98.2 F (36.8 C) (Oral)   Resp 16   SpO2 98%   Physical Exam Vitals signs and nursing note reviewed.  Constitutional:      Appearance: He is well-developed. He is not diaphoretic.  HENT:     Head: Normocephalic and atraumatic.     Mouth/Throat:     Mouth: Mucous membranes are not dry.  Eyes:     Conjunctiva/sclera: Conjunctivae normal.  Neck:     Musculoskeletal: Normal range of motion and neck supple. No muscular tenderness.     Vascular: Normal carotid pulses. No carotid bruit or JVD.     Trachea: Trachea normal. No tracheal deviation.  Cardiovascular:     Rate and Rhythm: Normal rate and regular rhythm.     Pulses: No decreased pulses.     Heart sounds: Normal heart sounds, S1 normal and S2 normal. Heart sounds not distant. No murmur.  Pulmonary:     Effort: Pulmonary effort is normal. No respiratory distress.     Breath sounds: Normal breath sounds. No wheezing.  Chest:     Chest wall: No tenderness or edema.    Abdominal:     General: Bowel sounds are normal.     Palpations: Abdomen is soft.     Tenderness: There is no abdominal tenderness. There is no guarding or rebound.  Musculoskeletal:     Right lower leg: No edema.     Left lower leg: No edema.     Comments: No tenderness or swelling of the lower extremities.  No symptoms consistent with DVT.  Skin:    General: Skin is warm and dry.     Coloration: Skin is not pale.     Comments: No signs of shingles over affected area.   Neurological:     Mental Status: He is alert.  Psychiatric:        Mood and Affect: Mood is anxious.      ED Treatments / Results  Labs (all labs ordered are listed, but only abnormal results are displayed) Labs Reviewed - No data to display  ED ECG REPORT   Date: 10/13/2019  Rate: 100  Rhythm: normal sinus rhythm  QRS Axis: normal  Intervals: normal  ST/T Wave abnormalities: normal  Conduction Disutrbances:none   Narrative Interpretation:   Old EKG Reviewed: unchanged  I have personally reviewed the EKG tracing and agree with the computerized printout as noted.  Radiology Dg Chest 2 View  Result Date: 10/13/2019 CLINICAL DATA:  Chest pain EXAM: CHEST - 2 VIEW COMPARISON:  April 12, 2015 FINDINGS: There is slight atelectatic change in the lingula. There is no edema or consolidation. Heart is upper normal in size with pulmonary vascularity normal. No adenopathy. No pneumothorax. No bone lesions. IMPRESSION: Slight atelectatic change in the lingula. No edema or consolidation. Heart upper normal in size. Electronically Signed   By:  Lowella Grip III M.D.   On: 10/13/2019 08:06    Procedures Procedures (including critical care time)  Medications Ordered in ED Medications  ketorolac (TORADOL) 15 MG/ML injection 15 mg (has no administration in time range)  methocarbamol (ROBAXIN) tablet 1,000 mg (has no administration in time range)     Initial Impression / Assessment and Plan / ED Course  I have reviewed the triage vital signs and the nursing notes.  Pertinent labs & imaging results that were available during my care of the patient were reviewed by me and considered in my medical decision making (see chart for details).        Patient seen and examined. Work-up initiated. Medications ordered.   Vital signs reviewed and are as follows: BP (!) 130/91   Pulse 79   Temp 98.2 F (36.8 C) (Oral)   Resp 16   SpO2 100%   9:20 AMpatient has received medications.  I reevaluated the patient.  He is sitting up on the side of the bed.  Continues to be anxious.  Heart rate in the 90s.  100% oxygen saturation on room air.  He looks well.  Plan to discharge to home with naproxen, Robaxin, ice and heat on the area as needed.  Encouraged return to the emergency department with with any chest pain, worsening shortness of breath, fever, difficulty breathing, increased work of breathing, new symptoms or  other concerns.  Discussed that if he develops a rash over this area that he should be seen by primary care or return to the emergency department for further management.  Final Clinical Impressions(s) / ED Diagnoses   Final diagnoses:  Right-sided chest wall pain   Patient with right-sided pain that appears to be musculoskeletal in nature.  I do not see any skin rashes consistent with shingles.  Patient is very atypical for ACS.  Symptoms started at rest and is nonexertional.  No vomiting or diaphoresis.  EKG is normal.  No previous cardiac history.  Symptoms are readily reproducible with movement and palpation over the chest wall itself.  Patient winces in pain with movement.  Patient does not have any other risk factors for PE.  No hypoxia.  Mild tachycardia at times, suspect due to anxiety.  Patient is very anxious.  He looks well and is in no distress during ED stay, reassessment, time of discharge.  Feel symptomatic management is indicated at this point with PCP follow-up if not improving and return to the emergency department with worsening.  ED Discharge Orders         Ordered    naproxen (NAPROSYN) 500 MG tablet  2 times daily     10/13/19 0922    methocarbamol (ROBAXIN) 500 MG tablet  4 times daily     10/13/19 0922           Carlisle Cater, PA-C 10/13/19 [id number]    Lajean Saver, MD 10/13/19 1441

## 2019-10-14 ENCOUNTER — Ambulatory Visit: Payer: BC Managed Care – PPO | Admitting: Family Medicine

## 2019-10-14 ENCOUNTER — Other Ambulatory Visit: Payer: Self-pay

## 2019-10-14 ENCOUNTER — Other Ambulatory Visit (INDEPENDENT_AMBULATORY_CARE_PROVIDER_SITE_OTHER): Payer: BC Managed Care – PPO

## 2019-10-14 ENCOUNTER — Encounter: Payer: Self-pay | Admitting: Family Medicine

## 2019-10-14 VITALS — BP 110/70 | HR 119 | Ht 67.0 in | Wt 240.0 lb

## 2019-10-14 DIAGNOSIS — M545 Low back pain, unspecified: Secondary | ICD-10-CM

## 2019-10-14 DIAGNOSIS — M2550 Pain in unspecified joint: Secondary | ICD-10-CM

## 2019-10-14 DIAGNOSIS — R1011 Right upper quadrant pain: Secondary | ICD-10-CM

## 2019-10-14 LAB — CBC WITH DIFFERENTIAL/PLATELET
Basophils Absolute: 0 10*3/uL (ref 0.0–0.1)
Basophils Relative: 0.5 % (ref 0.0–3.0)
Eosinophils Absolute: 0.1 10*3/uL (ref 0.0–0.7)
Eosinophils Relative: 1.1 % (ref 0.0–5.0)
HCT: 40.8 % (ref 39.0–52.0)
Hemoglobin: 13.9 g/dL (ref 13.0–17.0)
Lymphocytes Relative: 32 % (ref 12.0–46.0)
Lymphs Abs: 1.9 10*3/uL (ref 0.7–4.0)
MCHC: 34 g/dL (ref 30.0–36.0)
MCV: 96.4 fl (ref 78.0–100.0)
Monocytes Absolute: 0.4 10*3/uL (ref 0.1–1.0)
Monocytes Relative: 6.6 % (ref 3.0–12.0)
Neutro Abs: 3.5 10*3/uL (ref 1.4–7.7)
Neutrophils Relative %: 59.8 % (ref 43.0–77.0)
Platelets: 225 10*3/uL (ref 150.0–400.0)
RBC: 4.23 Mil/uL (ref 4.22–5.81)
RDW: 13.1 % (ref 11.5–15.5)
WBC: 5.8 10*3/uL (ref 4.0–10.5)

## 2019-10-14 LAB — PROTIME-INR
INR: 1.1 ratio — ABNORMAL HIGH (ref 0.8–1.0)
Prothrombin Time: 12.4 s (ref 9.6–13.1)

## 2019-10-14 LAB — COMPREHENSIVE METABOLIC PANEL
ALT: 20 U/L (ref 0–53)
AST: 17 U/L (ref 0–37)
Albumin: 4.4 g/dL (ref 3.5–5.2)
Alkaline Phosphatase: 48 U/L (ref 39–117)
BUN: 12 mg/dL (ref 6–23)
CO2: 25 mEq/L (ref 19–32)
Calcium: 9.2 mg/dL (ref 8.4–10.5)
Chloride: 100 mEq/L (ref 96–112)
Creatinine, Ser: 0.78 mg/dL (ref 0.40–1.50)
GFR: 130.39 mL/min (ref >60.00)
Glucose, Bld: 89 mg/dL (ref 70–99)
Potassium: 3.9 mEq/L (ref 3.5–5.1)
Sodium: 134 mEq/L — ABNORMAL LOW (ref 135–145)
Total Bilirubin: 0.5 mg/dL (ref 0.2–1.2)
Total Protein: 7.7 g/dL (ref 6.0–8.3)

## 2019-10-14 LAB — LIPASE: Lipase: 23 U/L (ref 11.0–59.0)

## 2019-10-14 LAB — GAMMA GT: GGT: 38 U/L (ref 7–51)

## 2019-10-14 MED ORDER — KETOROLAC TROMETHAMINE 60 MG/2ML IM SOLN
60.0000 mg | Freq: Once | INTRAMUSCULAR | Status: AC
  Administered 2019-10-14: 60 mg via INTRAMUSCULAR

## 2019-10-14 MED ORDER — METHYLPREDNISOLONE ACETATE 80 MG/ML IJ SUSP
80.0000 mg | Freq: Once | INTRAMUSCULAR | Status: AC
  Administered 2019-10-14: 80 mg via INTRAMUSCULAR

## 2019-10-14 NOTE — Assessment & Plan Note (Signed)
Acute onset of right upper quadrant pain.  Associated with some shortness of breath.  Patient has had shortness of breath over the course of time.  Patient is having a flare and was seen in the emergency room.  EKG was normal and chest x-ray was normal.  We will get laboratory work-up including D-dimer to rule out any type of clot.  Patient knows of severe worsening pain to seek medical attention immediately.  If labs are unremarkable but continuing to have pain I would say a CT chest, and an abdominal pelvis with and without contrast will be necessary.  The patient is in agreement with the plan.

## 2019-10-14 NOTE — Patient Instructions (Addendum)
Injections today Lab and xray downstairs Send a message in 1-2 days let me know how you are feeling If pain worsens, please do into ER

## 2019-10-14 NOTE — Progress Notes (Signed)
[name] [address], [place] [address] Phone: [phone] Subjective:   William Bowman, am serving as a scribe for Dr. Hulan Saas.   CC: Back pain, right-sided pain  [id number]   06/05/2018 Abnormal findings on x-ray.  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  Patient is in agreement with the plan.  Depending on findings he could be a candidate for possible injection.  Update 10/14/2019 William Bowman is a 43 y.o. male coming in with complaint of back pain. Last seen in 2019 for shoulder pain. Patient states that he was at work on Sunday and he exhaled and developed pain on right side of ribs into the middle of his thoracic spine. Pain feels like it is pressing on stomach. 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 Since Sunday his pain has been unchanged with intermittent intense pain in the right upper quadrant. Has not thrown up nor noticed blood in stool or urine. Is using medication from ED which are not working.    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 Asthma   . Eczema   . Pneumonia    History reviewed. Bowman pertinent surgical history. Social History   Socioeconomic History  . Marital status: Single    Spouse name: Not on file  . Number of children: Not on file  . Years of education: Not on file  . Highest education level: Not on file  Occupational History  . Not on file  Social Needs  . Financial resource strain: Not on file  . Food insecurity    Worry: Not on file    Inability: Not on file  . Transportation needs    Medical: Not on file    Non-medical: Not on file  Tobacco Use  . Smoking status: Former Smoker   Years: 0.50    Types: Cigarettes  . Smokeless tobacco: Never Used  Substance and Sexual Activity  . Alcohol use: Yes    Comment: ocassionally beer and/or liquor  . Drug use: Yes    Frequency: 2.0 times per week    Types: Marijuana  . Sexual activity: Not on file  Lifestyle  . Physical activity    Days per week: Not on file    Minutes per session: Not on file  . Stress: Not on file  Relationships  . Social Herbalist on phone: Not on file    Gets together: Not on file    Attends religious service: Not on file    Active member of club or organization: Not on file    Attends meetings of clubs or organizations: Not on file    Relationship status: Not on file  Other Topics Concern  . Not on file  Social History Narrative  . Not on file   Bowman Known Allergies Family History  Adopted: Yes      Current Outpatient Medications (Respiratory):  .  albuterol (VENTOLIN HFA) 108 (90 Base) MCG/ACT inhaler, Inhale 2 puffs into the lungs every 4 (four) hours as needed for wheezing or shortness of breath (cough). .  cetirizine (ZYRTEC) 10 MG tablet, Take 10 mg by mouth daily as needed for allergies. Marland Kitchen  Fluticasone Propionate (XHANCE) 93 MCG/ACT EXHU, Place 2 sprays into the nose 2 (two) times a day. Truett Perna 93 MCG/ACT EXHU, BLOW 2 DOSES IN EACH NOSTRIL TWICE DAILY  Current Outpatient Medications (Analgesics):  .  naproxen (NAPROSYN) 500 MG tablet, Take 1 tablet (500 mg total) by mouth 2 (two) times daily.   Current Outpatient Medications (Other):  .  gabapentin (NEURONTIN) 100 MG capsule, Take 2 capsules (200 mg total) by mouth at bedtime. .  methocarbamol (ROBAXIN) 500 MG tablet, Take 2 tablets (1,000 mg total) by mouth 4 (four) times daily. .  Olopatadine HCl 0.2 % SOLN, Apply 1 drop to eye daily as needed. .  terbinafine (LAMISIL) 250 MG tablet, Take 1 tablet (250 mg total) by mouth daily. Marland Kitchen  triamcinolone ointment (KENALOG) 0.5 %, Apply 1 application topically 2 (two)  times daily.    Past medical history, social, surgical and family history all reviewed in electronic medical record.  Bowman pertanent information unless stated regarding to the chief complaint.   Review of Systems:  Bowman headache, visual changes, nausea, vomiting, diarrhea, constipation, dizziness, skin rash, fevers, chills, night sweats, weight loss, swollen lymph nodes, body aches, joint swelling, muscle aches, chest pain, shortness of breath, mood changes.  Positive abdominal pain Objective  Blood pressure 110/70, pulse (!) 119, height 5\' 7"  (1.702 m), weight 240 lb (108.9 kg), SpO2 96 %.    General: Bowman apparent distress alert and oriented x3 mood and affect normal, dressed appropriately.  HEENT: Pupils equal, extraocular movements intact  Respiratory: Patient's speak in full sentences and does not appear short of breath, CTAB Bowman W/r/r Cardiovascular: trace lower extremity edema, non tender, Bowman erythema  Skin: Warm dry intact with Bowman signs of infection or rash on extremities or on axial skeleton.  Abdomen: Soft mildly distended.  More on the right side seems to be under the intercostal margin + TTP RUQ mild gaurding  Neuro: Cranial nerves II through XII are intact, neurovascularly intact in all extremities with 2+ DTRs and 2+ pulses.  Lymph: Bowman lymphadenopathy of posterior or anterior cervical chain or axillae bilaterally.  Gait normal with good balance and coordination.  MSK:  tender with limited range of motion and stability and symmetric strength and tone of shoulders, elbows, wrist, hip, knee and ankles bilaterally.      Impression and Recommendations:     This case required medical decision making of moderate complexity. The above documentation has been reviewed and is accurate and complete Lyndal Pulley, DO       Note: This dictation was prepared with Dragon dictation along with smaller phrase technology. Any transcriptional errors that result from this process are  unintentional.

## 2019-10-15 ENCOUNTER — Encounter: Payer: Self-pay | Admitting: Gastroenterology

## 2019-10-15 LAB — D-DIMER, QUANTITATIVE: D-Dimer, Quant: 0.41 mcg/mL FEU (ref <0.50)

## 2019-11-18 ENCOUNTER — Ambulatory Visit: Payer: BC Managed Care – PPO | Admitting: Gastroenterology

## 2019-11-18 ENCOUNTER — Other Ambulatory Visit: Payer: Self-pay

## 2019-11-18 ENCOUNTER — Encounter: Payer: Self-pay | Admitting: Gastroenterology

## 2019-11-18 VITALS — BP 126/80 | HR 104 | Ht 67.0 in | Wt 245.0 lb

## 2019-11-18 DIAGNOSIS — R194 Change in bowel habit: Secondary | ICD-10-CM

## 2019-11-18 DIAGNOSIS — Z1211 Encounter for screening for malignant neoplasm of colon: Secondary | ICD-10-CM

## 2019-11-18 DIAGNOSIS — R140 Abdominal distension (gaseous): Secondary | ICD-10-CM | POA: Diagnosis not present

## 2019-11-18 DIAGNOSIS — R1031 Right lower quadrant pain: Secondary | ICD-10-CM

## 2019-11-18 DIAGNOSIS — Z1159 Encounter for screening for other viral diseases: Secondary | ICD-10-CM

## 2019-11-18 MED ORDER — SUPREP BOWEL PREP KIT 17.5-3.13-1.6 GM/177ML PO SOLN: ORAL | 0 refills | Status: DC

## 2019-11-18 NOTE — Patient Instructions (Addendum)
If you are age 44 or older, your body mass index should be between 23-30. Your Body mass index is 38.37 kg/m. If this is out of the aforementioned range listed, please consider follow up with your Primary Care Provider.  If you are age 96 or younger, your body mass index should be between 19-25. Your Body mass index is 38.37 kg/m. If this is out of the aformentioned range listed, please consider follow up with your Primary Care Provider.   You have been scheduled for a colonoscopy. Please follow written instructions given to you at your visit today.  Please pick up your prep supplies at the pharmacy within the next 1-3 days. If you use inhalers (even only as needed), please bring them with you on the day of your procedure. Your physician has requested that you go to www.startemmi.com and enter the access code given to you at your visit today. This web site gives a general overview about your procedure. However, you should still follow specific instructions given to you by our office regarding your preparation for the procedure.  We are giving you a low FOD-Map diet to follow.  Take a daily probiotic, such as Florastor.  Thank you for entrusting me with your care and for choosing [facility], Dr. Yorklyn Cellar

## 2019-11-18 NOTE — Progress Notes (Signed)
HPI :  43 y/o male with a history of asthma and eczema, referred here by Dr. Grier Mitts for a consultation regarding abdominal bloating and altered bowel habits.  He states for the past 5 years he feels diffusely bloated in his abdomen most of the time.  He belches up a lot of air and states that he has generalized discomfort from this.  He has a bowel movement about 1-2 times a day.  He denies any constipation but he does have some ongoing sense of incomplete evacuation with his bowel habits and feels that he often needs to go back to the bathroom after a bowel movement to evacuate further.  He denies any blood in his stool.  He has normal stool form.  He does have some persistent right lower quadrant pain that is also been going on for very long time, radiates around into his mid back, states like it is a pressure type of discomfort that is present all the time.  He does not notice any improvement with his bowel movements.  He denies any changes to his diet or eating that makes his symptoms any better or worse.  He denies any weight loss, in fact has gained 5 pounds recently.  He denies any typical reflux symptoms but does have regurgitation at times.  No dysphagia that bothers him.  He is never had a prior colonoscopy.  No family history of colon cancer.  He does not take much of anything for his bowels.  He is tried Gas-X in the past which has not provided any benefit.  He has tried probiotics when questioned about this, last in 2012, he does think it helps some of his symptoms at the time.  No chronic NSAID use.  He turns age 23 in January.     Past Medical History:  Diagnosis Date  . Asthma   . Eczema   . Pneumonia      Past Surgical History:  Procedure Laterality Date  . NO PAST SURGERIES     Family History  Adopted: Yes  Problem Relation Age of Onset  . Diabetes Mother   . Pancreatic cancer Maternal Aunt   . Diabetes Maternal Uncle   . Colon cancer Neg Hx   . Stomach  cancer Neg Hx    Social History   Tobacco Use  . Smoking status: Former Smoker    Years: 0.50    Types: Cigarettes  . Smokeless tobacco: Never Used  Substance Use Topics  . Alcohol use: Yes    Comment: ocassionally beer and/or liquor  . Drug use: Yes    Frequency: 2.0 times per week    Types: Marijuana   Current Outpatient Medications  Medication Sig Dispense Refill  . albuterol (VENTOLIN HFA) 108 (90 Base) MCG/ACT inhaler Inhale 2 puffs into the lungs every 4 (four) hours as needed for wheezing or shortness of breath (cough). 1 Inhaler 5  . cetirizine (ZYRTEC) 10 MG tablet Take 10 mg by mouth daily as needed for allergies.    . Fluticasone Propionate (XHANCE) 93 MCG/ACT EXHU Place 2 sprays into the nose 2 (two) times a day. 16 mL 5  . gabapentin (NEURONTIN) 100 MG capsule Take 2 capsules (200 mg total) by mouth at bedtime. 60 capsule 3  . methocarbamol (ROBAXIN) 500 MG tablet Take 2 tablets (1,000 mg total) by mouth 4 (four) times daily. 20 tablet 0  . naproxen (NAPROSYN) 500 MG tablet Take 1 tablet (500 mg total) by mouth 2 (two)  times daily. 20 tablet 0  . Olopatadine HCl 0.2 % SOLN Apply 1 drop to eye daily as needed. 2.5 mL 5  . terbinafine (LAMISIL) 250 MG tablet Take 1 tablet (250 mg total) by mouth daily. 30 tablet 3  . triamcinolone ointment (KENALOG) 0.5 % Apply 1 application topically 2 (two) times daily. 30 g 7  . XHANCE 93 MCG/ACT EXHU BLOW 2 DOSES IN EACH NOSTRIL TWICE DAILY 16 mL 5   No current facility-administered medications for this visit.    No Known Allergies   Review of Systems: All systems reviewed and negative except where noted in HPI.   Lab Results  Component Value Date   WBC 5.8 10/14/2019   HGB 13.9 10/14/2019   HCT 40.8 10/14/2019   MCV 96.4 10/14/2019   PLT 225.0 10/14/2019    Lab Results  Component Value Date   CREATININE 0.78 10/14/2019   BUN 12 10/14/2019   NA 134 (L) 10/14/2019   K 3.9 10/14/2019   CL 100 10/14/2019   CO2 25  10/14/2019    Lab Results  Component Value Date   ALT 20 10/14/2019   AST 17 10/14/2019   ALKPHOS 48 10/14/2019   BILITOT 0.5 10/14/2019     Physical Exam: BP 126/80   Pulse (!) 104   Ht 5\' 7"  (1.702 m)   Wt 245 lb (111.1 kg)   BMI 38.37 kg/m  Constitutional: Pleasant,well-developed male in no acute distress. HEENT: Normocephalic and atraumatic. Conjunctivae are normal. No scleral icterus. Neck supple.  Cardiovascular: Normal rate, regular rhythm.  Pulmonary/chest: Effort normal and breath sounds normal. No wheezing, rales or rhonchi. Abdominal: Soft, nondistended, nontender. There are no masses palpable. No hepatomegaly. Extremities: no edema Lymphadenopathy: No cervical adenopathy noted. Neurological: Alert and oriented to person place and time. Skin: Skin is warm and dry. No rashes noted. Psychiatric: Normal mood and affect. Behavior is normal.   ASSESSMENT AND PLAN: 43 year old male here for new patient consultation regarding the following:  Bloating / altered bowel habits / right lower quadrant pain / colon cancer screening - ongoing symptoms as described above.  These seem to be longstanding and have bothered the patient for some time.  The patient does not meet criteria for irritable bowel syndrome, suspect he could have multifactorial etiology, although given his age and that he is due for colon cancer screening in a little over 1 month, I am recommending a colonoscopy to ensure okay no polyp/mass lesion etc.  I discussed risks benefits of colonoscopy and anesthesia with him and he wanted to proceed.  Otherwise, it is possible his right lower quadrant pain could be musculoskeletal as it seems related to his low back pain, it does not seem to be related to his bowel function as he describes it today.  I also discussed intestinal gas with him, pathophysiology of bloating, we discussed options to manage this.  Recommend a trial of low FODMAP diet to see if that helps some, and  recommend a trial of a probiotic, we discussed options for this.  We will see if that helps him in regards to his bloating until the procedure is done.  Further recommendations pending his course and colonoscopy results.  All questions answered.  He agreed  Cadwell Cellar, MD [facility]  CC: Billie Ruddy, MD

## 2019-11-18 NOTE — Progress Notes (Signed)
order for Covid test at [facility] pathology - 12-8

## 2019-12-02 ENCOUNTER — Ambulatory Visit (INDEPENDENT_AMBULATORY_CARE_PROVIDER_SITE_OTHER): Payer: Self-pay

## 2019-12-02 ENCOUNTER — Other Ambulatory Visit: Payer: Self-pay | Admitting: Gastroenterology

## 2019-12-02 ENCOUNTER — Encounter: Payer: Self-pay | Admitting: Gastroenterology

## 2019-12-02 DIAGNOSIS — Z1159 Encounter for screening for other viral diseases: Secondary | ICD-10-CM | POA: Diagnosis not present

## 2019-12-03 LAB — SARS CORONAVIRUS 2 (TAT 6-24 HRS): SARS Coronavirus 2: NEGATIVE

## 2019-12-04 ENCOUNTER — Encounter: Payer: Self-pay | Admitting: Gastroenterology

## 2019-12-04 ENCOUNTER — Ambulatory Visit (AMBULATORY_SURGERY_CENTER): Payer: BC Managed Care – PPO | Admitting: Gastroenterology

## 2019-12-04 ENCOUNTER — Other Ambulatory Visit: Payer: Self-pay

## 2019-12-04 VITALS — BP 120/65 | HR 88 | Temp 98.2°F | Resp 17 | Ht 67.0 in | Wt 245.0 lb

## 2019-12-04 DIAGNOSIS — K5730 Diverticulosis of large intestine without perforation or abscess without bleeding: Secondary | ICD-10-CM | POA: Diagnosis not present

## 2019-12-04 DIAGNOSIS — R194 Change in bowel habit: Secondary | ICD-10-CM

## 2019-12-04 DIAGNOSIS — D120 Benign neoplasm of cecum: Secondary | ICD-10-CM | POA: Diagnosis not present

## 2019-12-04 DIAGNOSIS — R140 Abdominal distension (gaseous): Secondary | ICD-10-CM | POA: Diagnosis not present

## 2019-12-04 DIAGNOSIS — D122 Benign neoplasm of ascending colon: Secondary | ICD-10-CM | POA: Diagnosis not present

## 2019-12-04 DIAGNOSIS — R1031 Right lower quadrant pain: Secondary | ICD-10-CM

## 2019-12-04 MED ORDER — SODIUM CHLORIDE 0.9 % IV SOLN: 500.0000 mL | Freq: Once | INTRAVENOUS | Status: DC

## 2019-12-04 NOTE — Patient Instructions (Signed)
HANDOUTS PROVIDED ON: POLYPS, DIVERTICULOSIS, HEMORRHOIDS, & LOW FODMAP DIET  THE POLYPS REMOVED TODAY HAVE BEEN SENT FOR PATHOLOGY.  THE RESULTS CAN TAKE 2-3 WEEKS TO RECEIVE.  BASED ON THE RESULTS IS WHEN YOUR NEXT COLONOSCOPY WILL BE RECOMMENDED.  YOU MAY RESUME YOUR TRIAL OF THE LOW FODMAP DIET AND PROBIOTICS.   YOU MAY RESUME YOUR MEDICATION SCHEDULE.  [place] YOU FOR ALLOWING [place] TO CARE FOR YOU TODAY!!!  YOU HAD AN ENDOSCOPIC PROCEDURE TODAY AT [facility]:   Refer to the procedure report that was given to you for any specific questions about what was found during the examination.  If the procedure report does not answer your questions, please call your gastroenterologist to clarify.  If you requested that your care partner not be given the details of your procedure findings, then the procedure report has been included in a sealed envelope for you to review at your convenience later.  YOU SHOULD EXPECT: Some feelings of bloating in the abdomen. Passage of more gas than usual.  Walking can help get rid of the air that was put into your GI tract during the procedure and reduce the bloating. If you had a lower endoscopy (such as a colonoscopy or flexible sigmoidoscopy) you may notice spotting of blood in your stool or on the toilet paper. If you underwent a bowel prep for your procedure, you may not have a normal bowel movement for a few days.  Please Note:  You might notice some irritation and congestion in your nose or some drainage.  This is from the oxygen used during your procedure.  There is no need for concern and it should clear up in a day or so.  SYMPTOMS TO REPORT IMMEDIATELY:   Following lower endoscopy (colonoscopy or flexible sigmoidoscopy):  Excessive amounts of blood in the stool  Significant tenderness or worsening of abdominal pains  Swelling of the abdomen that is new, acute  Fever of 100F or higher  For urgent or emergent issues, a gastroenterologist  can be reached at any hour by calling [phone].   DIET:  We do recommend a small meal at first, but then you may proceed to your regular diet.  Drink plenty of fluids but you should avoid alcoholic beverages for 24 hours.  ACTIVITY:  You should plan to take it easy for the rest of today and you should NOT DRIVE or use heavy machinery until tomorrow (because of the sedation medicines used during the test).    FOLLOW UP: Our staff will call the number listed on your records 48-72 hours following your procedure to check on you and address any questions or concerns that you may have regarding the information given to you following your procedure. If we do not reach you, we will leave a message.  We will attempt to reach you two times.  During this call, we will ask if you have developed any symptoms of COVID 19. If you develop any symptoms (ie: fever, flu-like symptoms, shortness of breath, cough etc.) before then, please call [phone].  If you test positive for Covid 19 in the 2 weeks post procedure, please call and report this information to [place].    If any biopsies were taken you will be contacted by phone or by letter within the next 1-3 weeks.  Please call us at [phone] if you have not heard about the biopsies in 3 weeks.    SIGNATURES/CONFIDENTIALITY: You and/or your care partner have signed paperwork which  will be entered into your electronic medical record.  These signatures attest to the fact that that the information above on your After Visit Summary has been reviewed and is understood.  Full responsibility of the confidentiality of this discharge information lies with you and/or your care-partner.

## 2019-12-04 NOTE — Progress Notes (Signed)
A/ox3, pleased with MAC, report to RN 

## 2019-12-04 NOTE — Progress Notes (Signed)
Called to room to assist during endoscopic procedure.  Patient ID and intended procedure confirmed with present staff. Received instructions for my participation in the procedure from the performing physician.  

## 2019-12-04 NOTE — Op Note (Signed)
[facility] Patient Name: William Bowman Procedure Date: 12/04/2019 3:45 PM MRN: [id number] Endoscopist: William Bowman , MD Age: 43 Referring MD:  Date of Birth: 22-Mar-1976 Gender: Male Account #: [id number] Procedure:                Colonoscopy Indications:              This is the patient's first colonoscopy, Change in                            bowel habits, bloating Medicines:                Monitored Anesthesia Care Procedure:                Pre-Anesthesia Assessment:                           - Prior to the procedure, a History and Physical                            was performed, and patient medications and                            allergies were reviewed. The patient's tolerance of                            previous anesthesia was also reviewed. The risks                            and benefits of the procedure and the sedation                            options and risks were discussed with the patient.                            All questions were answered, and informed consent                            was obtained. Prior Anticoagulants: The patient has                            taken no previous anticoagulant or antiplatelet                            agents. ASA Grade Assessment: II - A patient with                            mild systemic disease. After reviewing the risks                            and benefits, the patient was deemed in                            satisfactory condition to undergo the procedure.  After obtaining informed consent, the colonoscope                            was passed under direct vision. Throughout the                            procedure, the patient's blood pressure, pulse, and                            oxygen saturations were monitored continuously. The                            Colonoscope was introduced through the anus and                            advanced to the the cecum,  identified by                            appendiceal orifice and ileocecal valve. The                            colonoscopy was performed without difficulty. The                            patient tolerated the procedure well. The quality                            of the bowel preparation was adequate. The                            ileocecal valve, appendiceal orifice, and rectum                            were photographed. Scope In: 3:47:28 PM Scope Out: 4:06:55 PM Scope Withdrawal Time: 0 hours 12 minutes 19 seconds  Total Procedure Duration: 0 hours 19 minutes 27 seconds  Findings:                 The perianal and digital rectal examinations were                            normal.                           A 4 mm polyp was found in the cecum. The polyp was                            sessile. The polyp was removed with a cold snare.                            Resection and retrieval were complete.                           A 5 mm polyp was found in the ascending colon. The  polyp was sessile. The polyp was removed with a                            cold snare. Resection and retrieval were complete.                           Multiple medium-mouthed diverticula were found in                            the ascending colon and cecum.                           Internal hemorrhoids were found during retroflexion.                           The exam was otherwise without abnormality. The                            right colon had looping and the IC valve was                            angulated. Ileal intubation was attempted a few                            times but not successful due to these factors. Complications:            No immediate complications. Estimated blood loss:                            Minimal. Estimated Blood Loss:     Estimated blood loss was minimal. Impression:               - One 4 mm polyp in the cecum, removed with a cold                             snare. Resected and retrieved.                           - One 5 mm polyp in the ascending colon, removed                            with a cold snare. Resected and retrieved.                           - Diverticulosis in the ascending colon and in the                            cecum.                           - Internal hemorrhoids.                           - The examination was otherwise normal. Recommendation:           -  Patient has a contact number available for                            emergencies. The signs and symptoms of potential                            delayed complications were discussed with the                            patient. Return to normal activities tomorrow.                            Written discharge instructions were provided to the                            patient.                           - Resume previous diet.                           - Continue present medications.                           - Await pathology results.                           - Continue with trial of low FODMAP diet and                            probiotics. If bloating persists, may consider                            trial of Rifaximin, will discuss with patient William Bowman. William Straus, MD 12/04/2019 4:13:12 PM This report has been signed electronically.

## 2019-12-04 NOTE — Progress Notes (Signed)
VS-DT Temp-JB 

## 2019-12-08 ENCOUNTER — Telehealth: Payer: Self-pay

## 2019-12-08 ENCOUNTER — Telehealth: Payer: Self-pay | Admitting: *Deleted

## 2019-12-08 NOTE — Telephone Encounter (Signed)
  Follow up Call-  Call back number 12/04/2019  Post procedure Call Back phone  # [phone]  Permission to leave phone message Yes  Some recent data might be hidden     Patient questions:  Message left to call us if necessary.

## 2019-12-08 NOTE — Telephone Encounter (Signed)
Left message on answering machine. 

## 2020-01-21 ENCOUNTER — Other Ambulatory Visit: Payer: Self-pay | Admitting: Family Medicine

## 2020-01-21 ENCOUNTER — Telehealth: Payer: Self-pay | Admitting: Family Medicine

## 2020-01-21 DIAGNOSIS — B351 Tinea unguium: Secondary | ICD-10-CM

## 2020-01-21 NOTE — Telephone Encounter (Signed)
   terbinafine (LAMISIL) 250 MG tablet   [place], [address] Phone:  [phone]  Fax:  [phone]

## 2020-01-22 NOTE — Telephone Encounter (Signed)
Rx was sent to pt pharmacy on 01/21/2020

## 2020-03-05 ENCOUNTER — Other Ambulatory Visit: Payer: Self-pay | Admitting: Family Medicine

## 2020-03-05 DIAGNOSIS — B351 Tinea unguium: Secondary | ICD-10-CM

## 2020-04-07 DIAGNOSIS — B351 Tinea unguium: Secondary | ICD-10-CM | POA: Diagnosis not present

## 2020-04-07 DIAGNOSIS — L280 Lichen simplex chronicus: Secondary | ICD-10-CM | POA: Diagnosis not present

## 2020-10-15 IMAGING — DX DG CHEST 2V
2 series · 2 of 2 positions shown · non-contrast
Comparison: April 12, 2015

CLINICAL DATA: Chest pain

EXAM:
CHEST - 2 VIEW

[w chest pa]
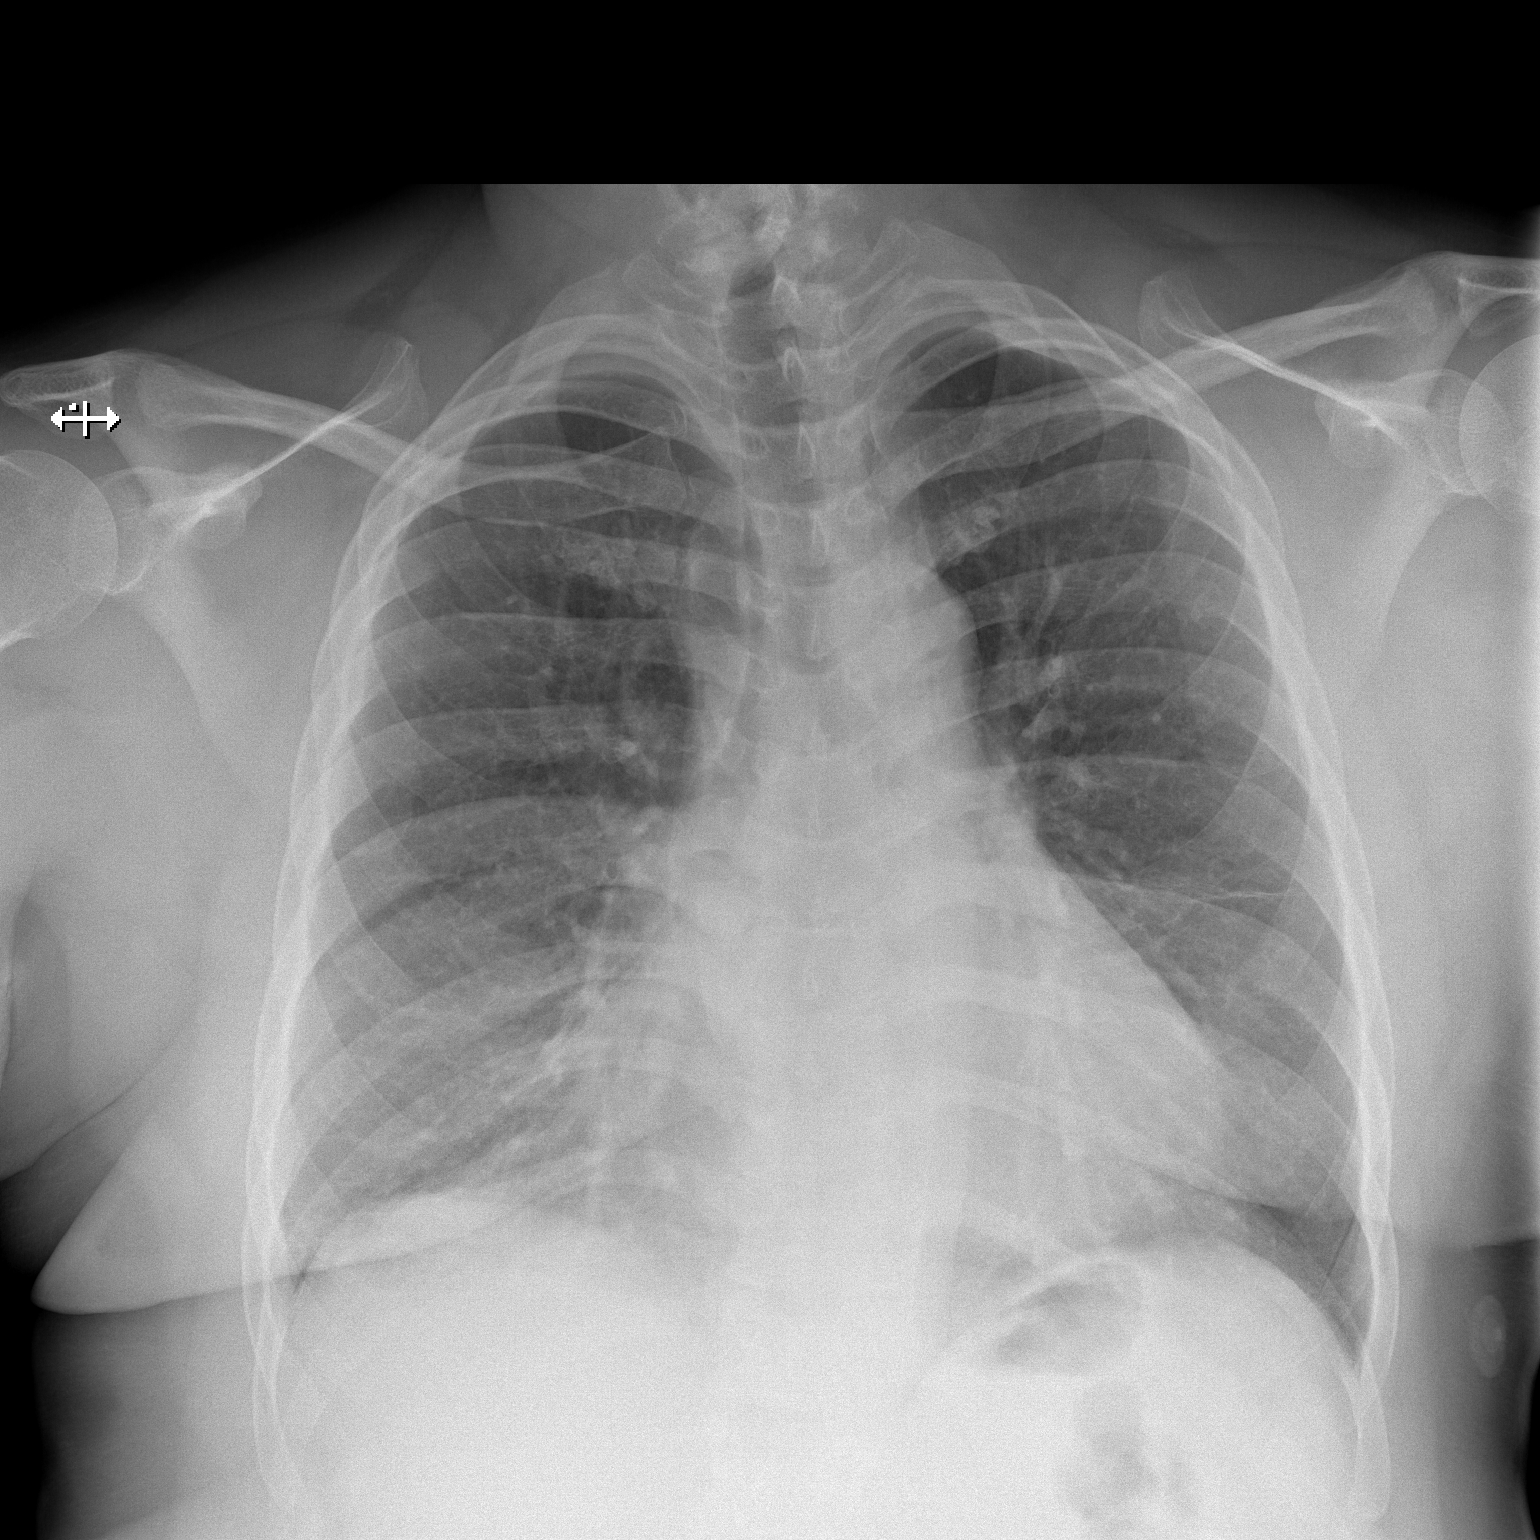

[w chest lat]
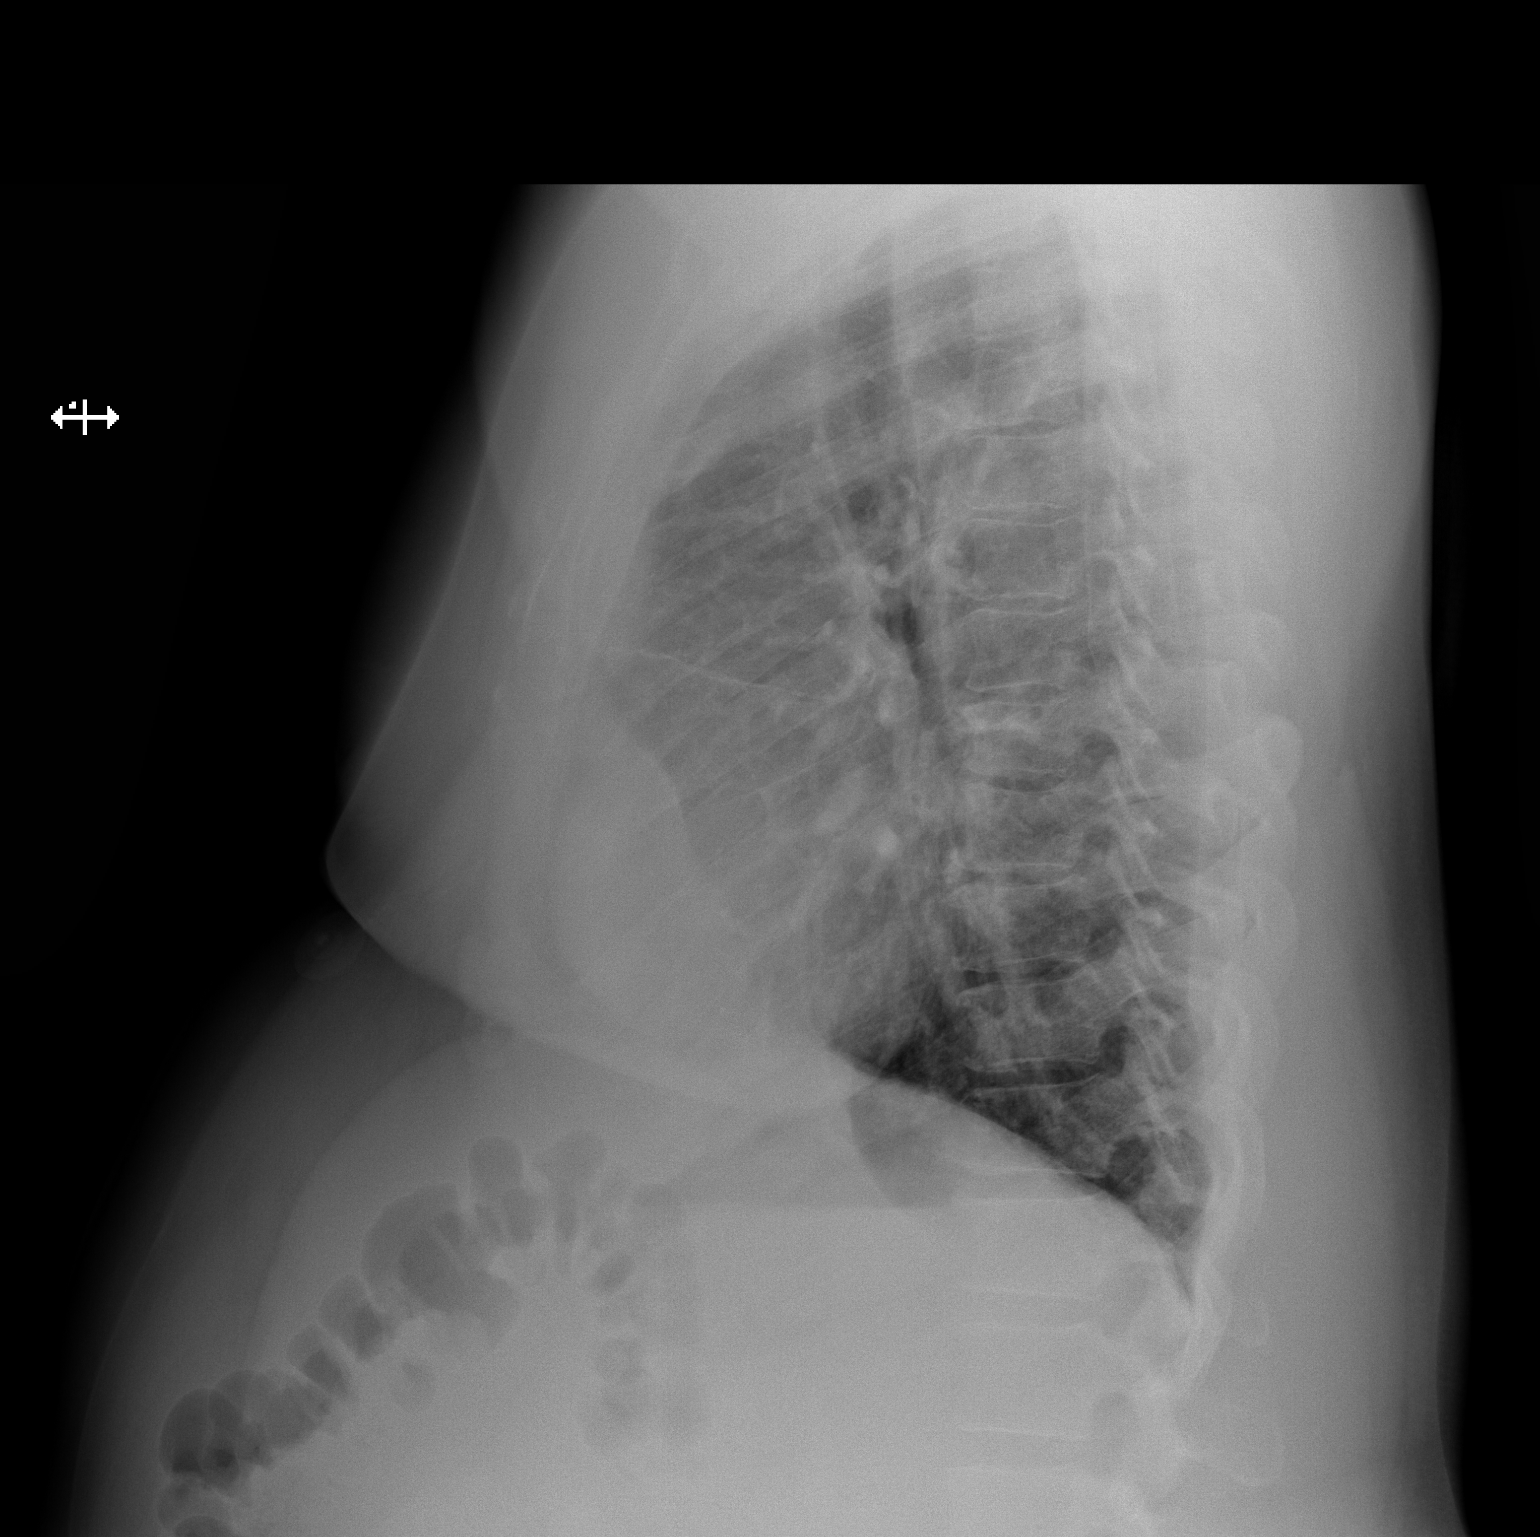

[2 of 2 positions shown; findings below may reference images not displayed]

FINDINGS: There is slight atelectatic change in the lingula. There is no edema
or consolidation. Heart is upper normal in size with pulmonary
vascularity normal. No adenopathy. No pneumothorax. No bone lesions.
IMPRESSION: Slight atelectatic change in the lingula. No edema or consolidation.
Heart upper normal in size.

## 2020-11-11 ENCOUNTER — Ambulatory Visit: Payer: Self-pay | Admitting: Family Medicine

## 2020-11-15 ENCOUNTER — Telehealth: Payer: Self-pay | Admitting: Family Medicine

## 2021-02-28 ENCOUNTER — Other Ambulatory Visit: Payer: Self-pay

## 2021-02-28 ENCOUNTER — Ambulatory Visit (INDEPENDENT_AMBULATORY_CARE_PROVIDER_SITE_OTHER): Payer: 59 | Admitting: Family Medicine

## 2021-02-28 ENCOUNTER — Encounter: Payer: Self-pay | Admitting: Family Medicine

## 2021-02-28 VITALS — BP 130/90 | HR 95 | Temp 98.4°F | Ht 67.0 in | Wt 250.2 lb

## 2021-02-28 DIAGNOSIS — R0602 Shortness of breath: Secondary | ICD-10-CM

## 2021-02-28 DIAGNOSIS — J302 Other seasonal allergic rhinitis: Secondary | ICD-10-CM

## 2021-02-28 DIAGNOSIS — R0683 Snoring: Secondary | ICD-10-CM

## 2021-02-28 DIAGNOSIS — F321 Major depressive disorder, single episode, moderate: Secondary | ICD-10-CM | POA: Diagnosis not present

## 2021-02-28 DIAGNOSIS — F411 Generalized anxiety disorder: Secondary | ICD-10-CM | POA: Diagnosis not present

## 2021-02-28 DIAGNOSIS — R030 Elevated blood-pressure reading, without diagnosis of hypertension: Secondary | ICD-10-CM

## 2021-02-28 DIAGNOSIS — B351 Tinea unguium: Secondary | ICD-10-CM

## 2021-02-28 DIAGNOSIS — L720 Epidermal cyst: Secondary | ICD-10-CM

## 2021-02-28 DIAGNOSIS — Z6838 Body mass index (BMI) 38.0-38.9, adult: Secondary | ICD-10-CM

## 2021-02-28 MED ORDER — SERTRALINE HCL 25 MG PO TABS: 25.0000 mg | ORAL_TABLET | Freq: Every day | ORAL | 2 refills | Status: DC

## 2021-02-28 MED ORDER — MONTELUKAST SODIUM 10 MG PO TABS: 10.0000 mg | ORAL_TABLET | Freq: Every day | ORAL | 3 refills | Status: DC

## 2021-02-28 NOTE — Progress Notes (Signed)
Subjective:    Patient ID: William Bowman, male    DOB: 12-Feb-1976, 45 y.o.   MRN: [id number]  Chief Complaint  Patient presents with  . Follow-up    HPI Patient is a 45 yo male with pmh sig for anxiety, seasonal allergies, reactive airway dz, eczema, polyarthralgia who was lost to f/u and seen today for numerous concerns.  Pt with occasional CP and SOB.  Noted more with increased stress of increased anxiety.  Endorses wt gain.  Pt notes insomnia.  Goes to bed at 2 am and wakes up a 630 am. Pt also snores.  Using nasal rinse for seasonal allergies.  Flonase was not helpful.  Pt has a pet cat which likely does not help.  Pt notes a bump on posterior head since Dec., initially with purulent drainage, now with thinner drainage.  Pt wears a bandaid 2/2 the drainage.  Pt with b/l toe fungus x yrs.  Makes pt self conscious.  Past Medical History:  Diagnosis Date  . Allergy   . Anxiety   . Asthma   . Depression   . Eczema   . Pneumonia     No Known Allergies  ROS General: Denies fever, chills, night sweats, changes in appetite +weight gain HEENT: Denies headaches, ear pain, changes in vision, rhinorrhea, sore throat CV: Denies CP, palpitations, SOB, orthopnea  +CP, SOB Pulm: Denies SOB, cough, wheezing +SOB, snoring GI: Denies abdominal pain, nausea, vomiting, diarrhea, constipation GU: Denies dysuria, hematuria, frequency Msk: Denies muscle cramps, joint pains Neuro: Denies weakness, numbness, tingling Skin: Denies rashes, bruising  +draining bump on head, toenail fungus Psych: Denies hallucinations  +anxiety, depression    Objective:    Blood pressure 130/90, pulse 95, temperature 98.4 F (36.9 C), temperature source Oral, height 5\' 7"  (1.702 m), weight 250 lb 3.2 oz (113.5 kg), SpO2 96 %.  Gen. Pleasant, well-nourished, in no distress, normal affect   HEENT: [place]/AT, face symmetric, conjunctiva clear, no scleral icterus, PERRLA, EOMI, nares patent without drainage, pharynx without  erythema or exudate. Lungs: no accessory muscle use, CTAB, no wheezes or rales Cardiovascular: RRR, no m/r/g, no peripheral edema Abdomen: BS present, soft, NT/ND, no hepatosplenomegaly. Musculoskeletal: No deformities, no cyanosis or clubbing, normal tone Neuro:  A&Ox3, CN II-XII intact, normal gait Skin:  Warm, no lesions/ rash.  L occipital area of scalp with bandaid in place.  Bandaid removed.  A 1 cm slightly raised lesion with fluctuance and central opening.  Scant purulent debris expressed with serous sanguinous fluid.  Area becomes flat after expression of fluid.  B/l toenails with onchomycosis.  Wt Readings from Last 3 Encounters:  02/28/21 250 lb 3.2 oz (113.5 kg)  12/04/19 245 lb (111.1 kg)  11/18/19 245 lb (111.1 kg)    Lab Results  Component Value Date   WBC 5.8 10/14/2019   HGB 13.9 10/14/2019   HCT 40.8 10/14/2019   PLT 225.0 10/14/2019   GLUCOSE 89 10/14/2019   CHOL 205 (H) 01/30/2018   TRIG 94.0 01/30/2018   HDL 48.70 01/30/2018   LDLCALC 137 (H) 01/30/2018   ALT 20 10/14/2019   AST 17 10/14/2019   NA 134 (L) 10/14/2019   K 3.9 10/14/2019   CL 100 10/14/2019   CREATININE 0.78 10/14/2019   BUN 12 10/14/2019   CO2 25 10/14/2019   TSH 0.76 01/30/2018   INR 1.1 (H) 10/14/2019    Assessment/Plan:  GAD (generalized anxiety disorder)  -GAD 7 score 18 -discussed counseling and medication options.  Pt  open to both -Given list of local [place] providers -will start Zoloft 25 mg daily -f/u sooner if needed - Plan: sertraline (ZOLOFT) 25 MG tablet  Depression, major, single episode, moderate (HCC)  -PHQ 9 score 17 -zoloft 25 mg daily -f/u in 4-6 wks, sooner if needed. - Plan: sertraline (ZOLOFT) 25 MG tablet  Snoring  -weight loss encouraged - Plan: PSG Sleep Study  Class 2 severe obesity due to excess calories with serious comorbidity and body mass index (BMI) of 38.0 to 38.9 in adult [facility])  -discussed increasing physical activity - Plan: PSG Sleep  Study  Seasonal allergies  - Plan: montelukast (SINGULAIR) 10 MG tablet  Epidermal cyst  -given location will refer to Derm for removal -given handout - Plan: Ambulatory referral to Dermatology  Elevated blood-pressure reading without diagnosis of hypertension  -elevated -not currently on meds. - Plan: PSG Sleep Study  SOB (shortness of breath) on exertion -possibly 2/2 deconditioning form wt gain, anxiety, reactive airway dz. -discussed CXR, 6 MWT  Onychomycosis -discussed treatment options -consider podiatry referral  F/u in 4-6 wks  Grier Mitts, MD

## 2021-02-28 NOTE — Patient Instructions (Signed)
[email or web address]">  Generalized Anxiety Disorder, Adult Generalized anxiety disorder (GAD) is a mental health condition. Unlike normal worries, anxiety related to GAD is not triggered by a specific event. These worries do not fade or get better with time. GAD interferes with relationships, work, and school. GAD symptoms can vary from mild to severe. People with severe GAD can have intense waves of anxiety with physical symptoms that are similar to panic attacks. What are the causes? The exact cause of GAD is not known, but the following are believed to have an impact:  Differences in natural brain chemicals.  Genes passed down from parents to children.  Differences in the way threats are perceived.  Development during childhood.  Personality. What increases the risk? The following factors may make you more likely to develop this condition:  Being male.  Having a family history of anxiety disorders.  Being very shy.  Experiencing very stressful life events, such as the death of a loved one.  Having a very stressful family environment. What are the signs or symptoms? People with GAD often worry excessively about many things in their lives, such as their health and family. Symptoms may also include:  Mental and emotional symptoms: ? Worrying excessively about natural disasters. ? Fear of being late. ? Difficulty concentrating. ? Fears that others are judging your performance.  Physical symptoms: ? Fatigue. ? Headaches, muscle tension, muscle twitches, trembling, or feeling shaky. ? Feeling like your heart is pounding or beating very fast. ? Feeling out of breath or like you cannot take a deep breath. ? Having trouble falling asleep or staying asleep, or experiencing restlessness. ? Sweating. ? Nausea, diarrhea, or irritable bowel syndrome (IBS).  Behavioral symptoms: ? Experiencing erratic moods or irritability. ? Avoidance of new situations. ? Avoidance of  people. ? Extreme difficulty making decisions. How is this diagnosed? This condition is diagnosed based on your symptoms and medical history. You will also have a physical exam. Your health care provider may perform tests to rule out other possible causes of your symptoms. To be diagnosed with GAD, a person must have anxiety that:  Is out of his or her control.  Affects several different aspects of his or her life, such as work and relationships.  Causes distress that makes him or her unable to take part in normal activities.  Includes at least three symptoms of GAD, such as restlessness, fatigue, trouble concentrating, irritability, muscle tension, or sleep problems. Before your health care provider can confirm a diagnosis of GAD, these symptoms must be present more days than they are not, and they must last for 6 months or longer. How is this treated? This condition may be treated with:  Medicine. Antidepressant medicine is usually prescribed for long-term daily control. Anti-anxiety medicines may be added in severe cases, especially when panic attacks occur.  Talk therapy (psychotherapy). Certain types of talk therapy can be helpful in treating GAD by providing support, education, and guidance. Options include: ? Cognitive behavioral therapy (CBT). People learn coping skills and self-calming techniques to ease their physical symptoms. They learn to identify unrealistic thoughts and behaviors and to replace them with more appropriate thoughts and behaviors. ? Acceptance and commitment therapy (ACT). This treatment teaches people how to be mindful as a way to cope with unwanted thoughts and feelings. ? Biofeedback. This process trains you to manage your body's response (physiological response) through breathing techniques and relaxation methods. You will work with a therapist while machines are used to monitor your physical   symptoms.  Stress management techniques. These include yoga,  meditation, and exercise. A mental health specialist can help determine which treatment is best for you. Some people see improvement with one type of therapy. However, other people require a combination of therapies.   Follow these instructions at home: Lifestyle  Maintain a consistent routine and schedule.  Anticipate stressful situations. Create a plan, and allow extra time to work with your plan.  Practice stress management or self-calming techniques that you have learned from your therapist or your health care provider. General instructions  Take over-the-counter and prescription medicines only as told by your health care provider.  Understand that you are likely to have setbacks. Accept this and be kind to yourself as you persist to take better care of yourself.  Recognize and accept your accomplishments, even if you judge them as small.  Keep all follow-up visits as told by your health care provider. This is important. Contact a health care provider if:  Your symptoms do not get better.  Your symptoms get worse.  You have signs of depression, such as: ? A persistently sad or irritable mood. ? Loss of enjoyment in activities that used to bring you joy. ? Change in weight or eating. ? Changes in sleeping habits. ? Avoiding friends or family members. ? Loss of energy for normal tasks. ? Feelings of guilt or worthlessness. Get help right away if:  You have serious thoughts about hurting yourself or others. If you ever feel like you may hurt yourself or others, or have thoughts about taking your own life, get help right away. Go to your nearest emergency department or:  Call your local emergency services (911 in the [place]  Call a suicide crisis helpline, such as the [facility] at (860) 244-1935. This is open 24 hours a day in the [place]  Text the Crisis Text Line at 712-519-8327 (in the [place].). Summary  Generalized anxiety disorder (GAD) is a mental  health condition that involves worry that is not triggered by a specific event.  People with GAD often worry excessively about many things in their lives, such as their health and family.  GAD may cause symptoms such as restlessness, trouble concentrating, sleep problems, frequent sweating, nausea, diarrhea, headaches, and trembling or muscle twitching.  A mental health specialist can help determine which treatment is best for you. Some people see improvement with one type of therapy. However, other people require a combination of therapies. This information is not intended to replace advice given to you by your health care provider. Make sure you discuss any questions you have with your health care provider. Document Revised: 10/01/2019 Document Reviewed: 10/01/2019 Elsevier Patient Education  2021 [place].  Epidermoid Cyst  An epidermoid cyst, also known as epidermal cyst, is a sac made of skin tissue. The sac contains a substance called keratin. Keratin is a protein that is normally secreted through the hair follicles. When keratin becomes trapped in the top layer of skin (epidermis), it can form an epidermoid cyst. Epidermoid cysts can be found anywhere on your body. These cysts are usually harmless (benign), and they may not cause symptoms unless they become inflamed or infected. What are the causes? This condition may be caused by:  A blocked hair follicle.  A hair that curls and re-enters the skin instead of growing straight out of the skin (ingrown hair).  A blocked pore.  Irritated skin.  An injury to the skin.  Certain conditions that are passed along  from parent to child (inherited).  Human papillomavirus (HPV). This happens rarely when cysts occur on the bottom of the feet.  Long-term (chronic) sun damage to the skin. What increases the risk? The following factors may make you more likely to develop an epidermoid cyst:  Having acne.  Being male.  Having an  injury to the skin.  Being past puberty.  Having certain rare genetic disorders. What are the signs or symptoms? The only symptom of this condition may be a small, painless lump underneath the skin. When an epidermal cyst ruptures, it may become inflamed. True infection in cysts is rare. Symptoms may include:  Redness.  Inflammation.  Tenderness.  Warmth.  Keratin draining from the cyst. Keratin is grayish-white, bad-smelling substance.  Pus draining from the cyst. How is this diagnosed? This condition is diagnosed with a physical exam.  In some cases, you may have a sample of tissue (biopsy) taken from your cyst to be examined under a microscope or tested for bacteria.  You may be referred to a health care provider who specializes in skin care (dermatologist). How is this treated? If a cyst becomes inflamed, treatment may include:  Opening and draining the cyst, done by a health care provider. After draining, minor surgery to remove the rest of the cyst may be done.  Taking antibiotic medicine.  Having injections of medicines (steroids) that help to reduce inflammation.  Having surgery to remove the cyst. Surgery may be done if the cyst: ? Becomes large. ? Bothers you. ? Has a chance of turning into cancer.  Do not try to open a cyst yourself. Follow these instructions at home: Medicines  If you were prescribed an antibiotic medicine, take it it as told by your health care provider. Do not stop using the antibiotic even if you start to feel better.  Take over-the-counter and prescription medicines only as told by your health care provider. General instructions  Keep the area around your cyst clean and dry.  Wear loose, dry clothing.  Avoid touching your cyst.  Check your cyst every day for signs of infection. Check for: ? Redness, swelling, or pain. ? Fluid or blood. ? Warmth. ? Pus or a bad smell.  Keep all follow-up visits. This is important. How is  this prevented?  Wear clean, dry, clothing.  Avoid wearing tight clothing.  Keep your skin clean and dry. Take showers or baths every day. Contact a health care provider if:  Your cyst develops symptoms of infection.  Your condition is not improving or is getting worse.  You develop a cyst that looks different from other cysts you have had.  You have a fever. Get help right away if:  Redness spreads from the cyst into the surrounding area. Summary  An epidermoid cyst is a sac made of skin tissue. These cysts are usually harmless (benign), and they may not cause symptoms unless they become inflamed.  If a cyst becomes inflamed, treatment may include surgery to open and drain the cyst, or to remove it. Treatment may also include medicines by mouth or through an injection.  Take over-the-counter and prescription medicines only as told by your health care provider. If you were prescribed an antibiotic medicine, take it as told by your health care provider. Do not stop using the antibiotic even if you start to feel better.  Contact a health care provider if your condition is not improving or is getting worse.  Keep all follow-up visits as told by your  health care provider. This is important. This information is not intended to replace advice given to you by your health care provider. Make sure you discuss any questions you have with your health care provider. Document Revised: 03/17/2020 Document Reviewed: 03/17/2020 Elsevier Patient Education  [place].  http://APA.org/depression-guideline"> https://clinicalkey.com"> http://point-of-care.elsevierperformancemanager.com/skills/"> http://point-of-care.elsevierperformancemanager.com">  Managing Depression, Adult Depression is a mental health condition that affects your thoughts, feelings, and actions. Being diagnosed with depression can bring you relief if you did not know why you have felt or behaved a certain way. It could  also leave you feeling overwhelmed with uncertainty about your future. Preparing yourself to manage your symptoms can help you feel more positive about your future. How to manage lifestyle changes Managing stress Stress is your body's reaction to life changes and events, both good and bad. Stress can add to your feelings of depression. Learning to manage your stress can help lessen your feelings of depression. Try some of the following approaches to reducing your stress (stress reduction techniques):  Listen to music that you enjoy and that inspires you.  Try using a meditation app or take a meditation class.  Develop a practice that helps you connect with your spiritual self. Walk in nature, pray, or go to a place of worship.  Do some deep breathing. To do this, inhale slowly through your nose. Pause at the top of your inhale for a few seconds and then exhale slowly, letting your muscles relax.  Practice yoga to help relax and work your muscles. Choose a stress reduction technique that suits your lifestyle and personality. These techniques take time and practice to develop. Set aside 5-15 minutes a day to do them. Therapists can offer training in these techniques. Other things you can do to manage stress include:  Keeping a stress diary.  Knowing your limits and saying no when you think something is too much.  Paying attention to how you react to certain situations. You may not be able to control everything, but you can change your reaction.  Adding humor to your life by watching funny films or TV shows.  Making time for activities that you enjoy and that relax you.   Medicines Medicines, such as antidepressants, are often a part of treatment for depression.  Talk with your pharmacist or health care provider about all the medicines, supplements, and herbal products that you take, their possible side effects, and what medicines and other products are safe to take together.  Make sure  to report any side effects you may have to your health care provider. Relationships Your health care provider may suggest family therapy, couples therapy, or individual therapy as part of your treatment. How to recognize changes Everyone responds differently to treatment for depression. As you recover from depression, you may start to:  Have more interest in doing activities.  Feel less hopeless.  Have more energy.  Overeat less often, or have a better appetite.  Have better mental focus. It is important to recognize if your depression is not getting better or is getting worse. The symptoms you had in the beginning may return, such as:  Tiredness (fatigue) or low energy.  Eating too much or too little.  Sleeping too much or too little.  Feeling restless, agitated, or hopeless.  Trouble focusing or making decisions.  Unexplained physical complaints.  Feeling irritable, angry, or aggressive. If you or your family members notice these symptoms coming back, let your health care provider know right away. Follow these instructions at  home: Activity  Try to get some form of exercise each day, such as walking, biking, swimming, or lifting weights.  Practice stress reduction techniques.  Engage your mind by taking a class or doing some volunteer work.   Lifestyle  Get the right amount and quality of sleep.  Cut down on using caffeine, tobacco, alcohol, and other potentially harmful substances.  Eat a healthy diet that includes plenty of vegetables, fruits, whole grains, low-fat dairy products, and lean protein. Do not eat a lot of foods that are high in solid fats, added sugars, or salt (sodium). General instructions  Take over-the-counter and prescription medicines only as told by your health care provider.  Keep all follow-up visits as told by your health care provider. This is important. Where to find support Talking to others Friends and family members can be sources  of support and guidance. Talk to trusted friends or family members about your condition. Explain your symptoms to them, and let them know that you are working with a health care provider to treat your depression. Tell friends and family members how they also can be helpful.   Finances  Find appropriate mental health providers that fit with your financial situation.  Talk with your health care provider about options to get reduced prices on your medicines. Where to find more information You can find support in your area from:  Anxiety and Depression Association of America (ADAA): www.adaa.org  Mental Health America: www.mentalhealthamerica.net  [employer] on Mental Illness: www.nami.org Contact a health care provider if:  You stop taking your antidepressant medicines, and you have any of these symptoms: ? Nausea. ? Headache. ? Light-headedness. ? Chills and body aches. ? Not being able to sleep (insomnia).  You or your friends and family think your depression is getting worse. Get help right away if:  You have thoughts of hurting yourself or others. If you ever feel like you may hurt yourself or others, or have thoughts about taking your own life, get help right away. Go to your nearest emergency department or:  Call your local emergency services (911 in the [place]  Call a suicide crisis helpline, such as the [facility] at 864 323 1866. This is open 24 hours a day in the [place]  Text the Crisis Text Line at 651 421 4773 (in the [place].). Summary  If you are diagnosed with depression, preparing yourself to manage your symptoms is a good way to feel positive about your future.  Work with your health care provider on a management plan that includes stress reduction techniques, medicines (if applicable), therapy, and healthy lifestyle habits.  Keep talking with your health care provider about how your treatment is working.  If you have thoughts about  taking your own life, call a suicide crisis helpline or text a crisis text line. This information is not intended to replace advice given to you by your health care provider. Make sure you discuss any questions you have with your health care provider. Document Revised: 10/22/2019 Document Reviewed: 10/22/2019 Elsevier Patient Education  2021 [place].  Shortness of Breath, Adult Shortness of breath means you have trouble breathing. Shortness of breath could be a sign of a medical problem. Follow these instructions at home:  Watch for any changes in your symptoms.  Do not use any products that contain nicotine or tobacco, such as cigarettes, e-cigarettes, and chewing tobacco.  Do not smoke. Smoking can cause shortness of breath. If you need help to quit smoking, ask your doctor.  Avoid things that can make it harder to breathe, such as: ? Mold. ? Dust. ? Air pollution. ? Chemical smells. ? Things that can cause allergy symptoms (allergens), if you have allergies.  Keep your living space clean. Use products that help remove mold and dust.  Rest as needed. Slowly return to your normal activities.  Take over-the-counter and prescription medicines only as told by your doctor. This includes oxygen therapy and inhaled medicines.  Keep all follow-up visits as told by your doctor. This is important.   Contact a doctor if:  Your condition does not get better as soon as expected.  You have a hard time doing your normal activities, even after you rest.  You have new symptoms. Get help right away if:  Your shortness of breath gets worse.  You have trouble breathing when you are resting.  You feel light-headed or you pass out (faint).  You have a cough that is not helped by medicines.  You cough up blood.  You have pain with breathing.  You have pain in your chest, arms, shoulders, or belly (abdomen).  You have a fever.  You cannot walk up stairs.  You cannot exercise  the way you normally do. These symptoms may represent a serious problem that is an emergency. Do not wait to see if the symptoms will go away. Get medical help right away. Call your local emergency services (911 in the [place] Do not drive yourself to the hospital. Summary  Shortness of breath is when you have trouble breathing enough air. It can be a sign of a medical problem.  Avoid things that make it hard for you to breathe, such as smoking, pollution, mold, and dust.  Watch for any changes in your symptoms. Contact your doctor if you do not get better or you get worse. This information is not intended to replace advice given to you by your health care provider. Make sure you discuss any questions you have with your health care provider. Document Revised: 05/13/2018 Document Reviewed: 05/13/2018 Elsevier Patient Education  2021 [place] for Sleep Apnea  Sleep apnea is a condition in which breathing pauses or becomes shallow during sleep. Sleep apnea screening is a test to determine if you are at risk for sleep apnea. The test is easy and only takes a few minutes. Your health care provider may ask you to have this test in preparation for surgery or as part of a physical exam. What are the symptoms of sleep apnea? Common symptoms of sleep apnea include:  Snoring.  Restless sleep.  Daytime sleepiness.  Pauses in breathing.  Choking during sleep.  Irritability.  Forgetfulness.  Trouble thinking clearly.  Depression.  Personality changes. Most people with sleep apnea are not aware that they have it. Why should I get screened? Getting screened for sleep apnea can help:  Ensure your safety. It is important for your health care providers to know whether or not you have sleep apnea, especially if you are having surgery or have other long-term (chronic) health conditions.  Improve your health and allow you to get a better night's rest. Restful sleep can help  you: ? Have more energy. ? Lose weight. ? Improve high blood pressure. ? Improve diabetes management. ? Prevent stroke. ? Prevent car accidents. How is screening done? Screening usually includes being asked a list of questions about your sleep quality. Some questions you may be asked include:  Do you snore?  Is your sleep restless?  Do you have daytime sleepiness?  Has a partner or spouse told you that you stop breathing during sleep?  Have you had trouble concentrating or memory loss? If your screening test is positive, you are at risk for the condition. Further testing may be needed to confirm a diagnosis of sleep apnea. Where to find more information You can find screening tools online or at your health care clinic. For more information about sleep apnea screening and healthy sleep, visit these websites:  Centers for Disease Control and Prevention: LearningDermatology.pl  American Sleep Apnea Association: www.sleepapnea.org Contact a health care provider if:  You think that you may have sleep apnea. Summary  Sleep apnea screening can help determine if you are at risk for sleep apnea.  It is important for your health care providers to know whether or not you have sleep apnea, especially if you are having surgery or have other chronic health conditions.  You may be asked to take a screening test for sleep apnea in preparation for surgery or as part of a physical exam. This information is not intended to replace advice given to you by your health care provider. Make sure you discuss any questions you have with your health care provider. Document Revised: 09/27/2018 Document Reviewed: 03/23/2017 Elsevier Patient Education  [place].

## 2021-03-12 ENCOUNTER — Encounter: Payer: Self-pay | Admitting: Family Medicine

## 2021-04-04 ENCOUNTER — Ambulatory Visit: Payer: 59 | Admitting: Family Medicine

## 2021-04-04 DIAGNOSIS — Z0289 Encounter for other administrative examinations: Secondary | ICD-10-CM

## 2021-04-13 ENCOUNTER — Ambulatory Visit (HOSPITAL_BASED_OUTPATIENT_CLINIC_OR_DEPARTMENT_OTHER): Payer: 59 | Attending: Family Medicine | Admitting: Internal Medicine

## 2021-04-13 ENCOUNTER — Other Ambulatory Visit: Payer: Self-pay

## 2021-04-13 VITALS — Ht 68.0 in | Wt 250.0 lb

## 2021-04-13 DIAGNOSIS — R0683 Snoring: Secondary | ICD-10-CM

## 2021-04-13 DIAGNOSIS — Z6838 Body mass index (BMI) 38.0-38.9, adult: Secondary | ICD-10-CM | POA: Diagnosis not present

## 2021-04-13 DIAGNOSIS — G4733 Obstructive sleep apnea (adult) (pediatric): Secondary | ICD-10-CM | POA: Insufficient documentation

## 2021-04-13 DIAGNOSIS — R030 Elevated blood-pressure reading, without diagnosis of hypertension: Secondary | ICD-10-CM

## 2021-04-17 DIAGNOSIS — R0683 Snoring: Secondary | ICD-10-CM | POA: Diagnosis not present

## 2021-04-17 NOTE — Procedures (Signed)
Patient Name: William Bowman, William Bowman Date: 04/13/2021 Gender: Male D.O.B: 1976-10-07 Age (years): 22 Referring Provider: Grier Mitts MD Height (inches): 68 Interpreting Physician: Baird Lyons MD, ABSM Weight (lbs): 250 RPSGT: Laren Everts BMI: 38 MRN: [id number] Neck Size: 17.25  CLINICAL INFORMATION Sleep Study Type: NPSG Indication for sleep study: Depression, Fatigue, Non-refreshing Sleep, Obesity, Snoring, Witnesses Apnea / Gasping During Sleep Epworth Sleepiness Score: 14  SLEEP STUDY TECHNIQUE As per the AASM Manual for the Scoring of Sleep and Associated Events v2.3 (April 2016) with a hypopnea requiring 4% desaturations.  The channels recorded and monitored were frontal, central and occipital EEG, electrooculogram (EOG), submentalis EMG (chin), nasal and oral airflow, thoracic and abdominal wall motion, anterior tibialis EMG, snore microphone, electrocardiogram, and pulse oximetry.  MEDICATIONS Medications self-administered by patient taken the night of the study : none reported  SLEEP ARCHITECTURE The study was initiated at 10:06:05 PM and ended at 4:19:42 AM.  Sleep onset time was 15.4 minutes and the sleep efficiency was 49.5%%. The total sleep time was 185 minutes.  Stage REM latency was 65.0 minutes.  The patient spent 14.9%% of the night in stage N1 sleep, 77.8%% in stage N2 sleep, 0.0%% in stage N3 and 7.3% in REM.  Alpha intrusion was absent.  Supine sleep was 100.00%.  RESPIRATORY PARAMETERS The overall apnea/hypopnea index (AHI) was 8.4 per hour. There were 2 total apneas, including 0 obstructive, 2 central and 0 mixed apneas. There were 24 hypopneas and 21 RERAs.  The AHI during Stage REM sleep was 48.9 per hour.  AHI while supine was 8.4 per hour.  The mean oxygen saturation was 95.4%. The minimum SpO2 during sleep was 87.0%.  moderate snoring was noted during this study.  CARDIAC DATA The 2 lead EKG demonstrated sinus rhythm. The  mean heart rate was 80.8 beats per minute. Other EKG findings include: None. LEG MOVEMENT DATA The total PLMS were 0 with a resulting PLMS index of 0.0. Associated arousal with leg movement index was 0.0 .  IMPRESSIONS - Mild obstructive sleep apnea occurred during this study (AHI = 8.4/h). - No significant central sleep apnea occurred during this study (CAI = 0.6/h). - Mild oxygen desaturation was noted during this study (Min O2 = 87.0%). Mean O2 saturation 95.4%. - The patient snored with moderate snoring volume. - No cardiac abnormalities were noted during this study. - Clinically significant periodic limb movements did not occur during sleep. No significant associated arousals. - The patient woke spontaneously around 01:39 AM and remained awake until study was ended at 04:19 AM.  DIAGNOSIS - Obstructive Sleep Apnea (G47.33)  RECOMMENDATIONS - Treatment for mild OSA is directed at symptoms. Conservative measures may include observation, weight loss and sleep position off back. - Other options including CPAP, a fitted oral appliance, or ENT evaluation, would be based on clinical judgment. This may be especially appropriate if medical intervention for insomnia is also considered. - Sleep hygiene should be reviewed to assess factors that may improve sleep quality. - Weight management and regular exercise should be initiated or continued if appropriate.  [Electronically signed] 04/17/2021 11:29 AM  Baird Lyons MD, ABSM Diplomate, American Board of Sleep Medicine   NPI: [id number]                          [facility]  ELECTRONICALLY SIGNED ON:  04/17/2021, 11:10 AM [place] PH: [phone]   FX: [phone] [id number] ACCREDITED BY THE AMERICAN ACADEMY OF SLEEP MEDICINE

## 2021-04-25 ENCOUNTER — Telehealth: Payer: Self-pay | Admitting: Family Medicine

## 2021-04-25 NOTE — Telephone Encounter (Signed)
The patient called wanting to know his sleep study results from 04/13/2021  Please advise

## 2021-04-27 NOTE — Progress Notes (Signed)
Patient viewed results on MyChart. 

## 2021-04-27 NOTE — Telephone Encounter (Signed)
Results were released and viewed on MyChart.

## 2021-05-20 ENCOUNTER — Ambulatory Visit: Payer: BC Managed Care – PPO | Admitting: Allergy

## 2021-08-30 NOTE — Progress Notes (Signed)
Follow Up Note  RE: William Bowman MRN: [id number] DOB: 1976/08/27 Date of Office Visit: 08/31/2021  Referring provider: Billie Ruddy, MD Primary care provider: Billie Ruddy, MD  Chief Complaint: Asthma  History of Present Illness: I had the pleasure of seeing William Bowman for a follow up visit at the [facility] on 08/31/2021. He is a 45 y.o. male, who is being followed for allergic rhino conjunctivitis and shortness of breath. His previous allergy office visit was on 06/16/2019 with Dr. Maudie Mercury. Today is a new complaint visit of trouble breathing and allergy flare. Failed to follow up as recommended.  Shortness of breath Patient has been non-compliant with his medications. His quality of life has suffered and gained about 20+ lbs over the last 2 years.   He noticed some shortness of breath/inability to take a deep breath at rest. This occurs on a daily basis. Denies any wheezing with this.  Denies any ER/urgent care visits or prednisone use since the last visit. No inhalers at home.  Patient also has anxiety and not sure if some of it may be due to that.  He had an EKG in the past which was unremarkable per patient report.    Seasonal and perennial allergic rhinoconjunctivitis 1 cat at home which goes into his bedroom.   He is always having nasal congestion, sneezing, rhinorrhea, watery eyes. Currently taking zyrtec '10mg'$  with some benefit. No nasal spray use. Using eye drops only as needed.   Skin Complaining of some dry skin lately. Seen dermatology for this as well. Moisturizing daily. Wants to know if allergies are causing his dry skin - most likely not.  Assessment and Plan: William Bowman is a 45 y.o. male with: Shortness of breath Past history - Episodes of shortness of breath, chest tightness, coughing and wheezing for the past 44 years.  Patient used to have an albuterol in the past but has not used one for 4 years.  Recently noted excessive  shortness of breath with any kind of exertion and inability to exhale fully.  Apparently had recent normal EKG.  No previous cardiac evaluation.  History of pneumonias in the past and there was a questionable COPD diagnosis as well. Last spirometry showed mixed restriction and mild obstruction with no improvement in FEV1 post bronchodilator treatment.  Patient clinically felt unchanged with Xopenex treatment. Tried Symbicort with no benefit.  Interim history - failed follow up as recommended. Shortness of breath unchanged, no inhalers at home. Gained 20+lbs and has some anxiety as well. Today's spirometry showed: possible restrictive disease with 8% improvement in FEV1 post bronchodilator treatment. Clinically feeling slightly improved.   Daily controller medication(s): START Breo 149mg 1 puff once a day and rinse mouth after each use. Demonstrated proper use.  May use albuterol rescue inhaler 2 puffs every 4 to 6 hours as needed for shortness of breath, chest tightness, coughing, and wheezing. May use albuterol rescue inhaler 2 puffs 5 to 15 minutes prior to strenuous physical activities. Monitor frequency of use.  Get spirometry at next visit. Discussed that sometimes anxiety can cause shortness of breath sensation as well - recommend following up with PCP regarding anxiety.  Seasonal and perennial allergic rhinoconjunctivitis Past history - Perennial rhinoconjunctivitis symptoms for the last 10+ years.  2020 skin testing showed: Positive to grass, weed, ragweed, trees, mold, dust mites, dog, cat, cockroach. 1 cat at home.  Interim history - not controlled symptoms.  Start environmental control measures as below. Use  over the counter antihistamines such as Zyrtec (cetirizine), Claritin (loratadine), Allegra (fexofenadine), or Xyzal (levocetirizine) daily as needed. May take twice a day during allergy flares. May switch antihistamines every few months. Use Nasacort (triamcinolone) nasal spray 1 spray  per nostril twice a day as needed for nasal congestion. Samples given. Use azelastine nasal spray 1-2 sprays per nostril twice a day as needed for runny nose/drainage. Use olopatadine eye drops 0.2% once a day as needed for itchy/watery eyes. Consider allergy injections for long term control if above medications do not help the symptoms - handout given.   Dry skin See below for proper skin care.   Return in about 2 months (around 10/31/2021).  Meds ordered this encounter  Medications   fluticasone furoate-vilanterol (BREO ELLIPTA) 100-25 MCG/INH AEPB    Sig: Inhale 1 puff into the lungs daily. Rinse mouth after each use.    Dispense:  60 each    Refill:  3   albuterol (PROAIR HFA) 108 (90 Base) MCG/ACT inhaler    Sig: Inhale 2 puffs into the lungs every 4 (four) hours as needed for wheezing or shortness of breath (coughing fits).    Dispense:  18 g    Refill:  1   azelastine (ASTELIN) 0.1 % nasal spray    Sig: Place 1-2 sprays into both nostrils 2 (two) times daily as needed (nasal drainage). Use in each nostril as directed    Dispense:  30 mL    Refill:  5   Olopatadine HCl 0.2 % SOLN    Sig: Apply 1 drop to eye daily as needed (itchy/watery eyes).    Dispense:  2.5 mL    Refill:  5    Lab Orders  No laboratory test(s) ordered today    Diagnostics: Spirometry:  Tracings reviewed. His effort: Good reproducible efforts. FVC: 1.98L FEV1: 1.43L, 39% predicted FEV1/FVC ratio: 72% Interpretation: Spirometry consistent with possible restrictive disease with 8% improvement in FEV1 post bronchodilator treatment. Clinically feeling slightly improved.   Please see scanned spirometry results for details.  Medication List:  Current Outpatient Medications  Medication Sig Dispense Refill   albuterol (PROAIR HFA) 108 (90 Base) MCG/ACT inhaler Inhale 2 puffs into the lungs every 4 (four) hours as needed for wheezing or shortness of breath (coughing fits). 18 g 1   azelastine (ASTELIN)  0.1 % nasal spray Place 1-2 sprays into both nostrils 2 (two) times daily as needed (nasal drainage). Use in each nostril as directed 30 mL 5   cetirizine (ZYRTEC) 10 MG tablet Take 10 mg by mouth daily as needed for allergies.     fluticasone furoate-vilanterol (BREO ELLIPTA) 100-25 MCG/INH AEPB Inhale 1 puff into the lungs daily. Rinse mouth after each use. 60 each 3   Olopatadine HCl 0.2 % SOLN Apply 1 drop to eye daily as needed (itchy/watery eyes). 2.5 mL 5   gabapentin (NEURONTIN) 100 MG capsule TAKE 2 CAPSULES BY MOUTH AT BEDTIME (Patient not taking: No sig reported) 60 capsule 0   methocarbamol (ROBAXIN) 500 MG tablet Take 2 tablets (1,000 mg total) by mouth 4 (four) times daily. (Patient not taking: No sig reported) 20 tablet 0   naproxen (NAPROSYN) 500 MG tablet Take 1 tablet (500 mg total) by mouth 2 (two) times daily. (Patient not taking: No sig reported) 20 tablet 0   sertraline (ZOLOFT) 25 MG tablet Take 1 tablet (25 mg total) by mouth daily. 30 tablet 2   terbinafine (LAMISIL) 250 MG tablet Take 1 tablet by mouth  once daily (Patient not taking: Reported on 02/28/2021) 30 tablet 0   triamcinolone ointment (KENALOG) 0.5 % Apply 1 application topically 2 (two) times daily. (Patient not taking: Reported on 02/28/2021) 30 g 7   No current facility-administered medications for this visit.   Allergies: No Known Allergies I reviewed his past medical history, social history, family history, and environmental history and no significant changes have been reported from his previous visit.  Review of Systems  Constitutional:  Negative for appetite change, chills, fever and unexpected weight change.  HENT:  Positive for congestion, postnasal drip and rhinorrhea.   Eyes:  Negative for itching.  Respiratory:  Positive for cough, chest tightness and shortness of breath. Negative for wheezing.   Gastrointestinal:  Negative for abdominal pain.  Skin:  Negative for rash.  Allergic/Immunologic:  Positive for environmental allergies.  Neurological:  Negative for headaches.   Objective: BP 134/84   Pulse 87   Temp 98.1 F (36.7 C) (Temporal)   Resp 16   Ht '5\' 7"'$  (1.702 m)   Wt 249 lb 9 oz (113.2 kg)   SpO2 97%   BMI 39.09 kg/m  Body mass index is 39.09 kg/m. Physical Exam Vitals and nursing note reviewed.  Constitutional:      Appearance: Normal appearance. He is well-developed. He is obese.  HENT:     Head: Normocephalic and atraumatic.     Right Ear: Tympanic membrane and external ear normal.     Left Ear: Tympanic membrane and external ear normal.     Nose: Nose normal.     Mouth/Throat:     Mouth: Mucous membranes are moist.     Pharynx: Oropharynx is clear.  Eyes:     Conjunctiva/sclera: Conjunctivae normal.  Cardiovascular:     Rate and Rhythm: Normal rate and regular rhythm.     Heart sounds: Normal heart sounds. No murmur heard. Pulmonary:     Effort: Pulmonary effort is normal.     Breath sounds: Normal breath sounds. No wheezing, rhonchi or rales.  Musculoskeletal:     Cervical back: Neck supple.  Skin:    General: Skin is warm and dry.     Findings: No rash.  Neurological:     Mental Status: He is alert and oriented to person, place, and time.  Psychiatric:        Behavior: Behavior normal.   Previous notes and tests were reviewed. The plan was reviewed with the patient/family, and all questions/concerned were addressed.  It was my pleasure to see William Bowman today and participate in his care. Please feel free to contact me with any questions or concerns.  Sincerely,  Rexene Alberts, DO Allergy & Immunology  [facility] office: [place] office: [phone]

## 2021-08-31 ENCOUNTER — Other Ambulatory Visit: Payer: Self-pay

## 2021-08-31 ENCOUNTER — Encounter: Payer: Self-pay | Admitting: Allergy

## 2021-08-31 ENCOUNTER — Ambulatory Visit (INDEPENDENT_AMBULATORY_CARE_PROVIDER_SITE_OTHER): Payer: 59 | Admitting: Allergy

## 2021-08-31 VITALS — BP 134/84 | HR 87 | Temp 98.1°F | Resp 16 | Ht 67.0 in | Wt 249.6 lb

## 2021-08-31 DIAGNOSIS — J302 Other seasonal allergic rhinitis: Secondary | ICD-10-CM | POA: Diagnosis not present

## 2021-08-31 DIAGNOSIS — R0602 Shortness of breath: Secondary | ICD-10-CM

## 2021-08-31 DIAGNOSIS — H1013 Acute atopic conjunctivitis, bilateral: Secondary | ICD-10-CM

## 2021-08-31 DIAGNOSIS — L853 Xerosis cutis: Secondary | ICD-10-CM

## 2021-08-31 DIAGNOSIS — J3089 Other allergic rhinitis: Secondary | ICD-10-CM

## 2021-08-31 DIAGNOSIS — H1010 Acute atopic conjunctivitis, unspecified eye: Secondary | ICD-10-CM

## 2021-08-31 MED ORDER — OLOPATADINE HCL 0.2 % OP SOLN
1.0000 [drp] | Freq: Every day | OPHTHALMIC | 5 refills | Status: DC | PRN
Start: 2021-08-31 — End: 2021-11-02

## 2021-08-31 MED ORDER — FLUTICASONE FUROATE-VILANTEROL 100-25 MCG/INH IN AEPB: 1.0000 | INHALATION_SPRAY | Freq: Every day | RESPIRATORY_TRACT | 3 refills | Status: DC

## 2021-08-31 MED ORDER — AZELASTINE HCL 0.1 % NA SOLN: 1.0000 | Freq: Two times a day (BID) | NASAL | 5 refills | Status: DC | PRN

## 2021-08-31 MED ORDER — ALBUTEROL SULFATE HFA 108 (90 BASE) MCG/ACT IN AERS: 2.0000 | INHALATION_SPRAY | RESPIRATORY_TRACT | 1 refills | Status: DC | PRN

## 2021-08-31 NOTE — Patient Instructions (Addendum)
Breathing:  Daily controller medication(s): START Breo 112mg 1 puff once a day and rinse mouth after each use. Demonstrated proper use.  May use albuterol rescue inhaler 2 puffs every 4 to 6 hours as needed for shortness of breath, chest tightness, coughing, and wheezing. May use albuterol rescue inhaler 2 puffs 5 to 15 minutes prior to strenuous physical activities. Monitor frequency of use.  Asthma control goals:  Full participation in all desired activities (may need albuterol before activity) Albuterol use two times or less a week on average (not counting use with activity) Cough interfering with sleep two times or less a month Oral steroids no more than once a year No hospitalizations   Environmental allergies 2020 skin testing showed: Positive to grass, weed, ragweed, trees, mold, dust mites, dog, cat, cockroach. Start environmental control measures as below. Use over the counter antihistamines such as Zyrtec (cetirizine), Claritin (loratadine), Allegra (fexofenadine), or Xyzal (levocetirizine) daily as needed. May take twice a day during allergy flares. May switch antihistamines every few months. Use Nasacort (triamcinolone) nasal spray 1 spray per nostril twice a day as needed for nasal congestion. Samples given. Use azelastine nasal spray 1-2 sprays per nostril twice a day as needed for runny nose/drainage.  Use olopatadine eye drops 0.2% once a day as needed for itchy/watery eyes.  Consider allergy injections for long term control if above medications do not help the symptoms - handout given.   Skin: See below for proper skin care.   Follow up in 2 months or sooner if needed.    Reducing Pollen Exposure Pollen seasons: trees (spring), grass (summer) and ragweed/weeds (fall). Keep windows closed in your home and car to lower pollen exposure.  Install air conditioning in the bedroom and throughout the house if possible.  Avoid going out in dry windy days - especially early  morning. Pollen counts are highest between 5 - 10 AM and on dry, hot and windy days.  Save outside activities for late afternoon or after a heavy rain, when pollen levels are lower.  Avoid mowing of grass if you have grass pollen allergy. Be aware that pollen can also be transported indoors on people and pets.  Dry your clothes in an automatic dryer rather than hanging them outside where they might collect pollen.  Rinse hair and eyes before bedtime. Mold Control Mold and fungi can grow on a variety of surfaces provided certain temperature and moisture conditions exist.  Outdoor molds grow on plants, decaying vegetation and soil. The major outdoor mold, Alternaria and Cladosporium, are found in very high numbers during hot and dry conditions. Generally, a late summer - fall peak is seen for common outdoor fungal spores. Rain will temporarily lower outdoor mold spore count, but counts rise rapidly when the rainy period ends. The most important indoor molds are Aspergillus and Penicillium. Dark, humid and poorly ventilated basements are ideal sites for mold growth. The next most common sites of mold growth are the bathroom and the kitchen. Outdoor (Seasonal) Mold Control Use air conditioning and keep windows closed. Avoid exposure to decaying vegetation. Avoid leaf raking. Avoid grain handling. Consider wearing a face mask if working in moldy areas.  Indoor (Perennial) Mold Control  Maintain humidity below 50%. Get rid of mold growth on hard surfaces with water, detergent and, if necessary, 5% bleach (do not mix with other cleaners). Then dry the area completely. If mold covers an area more than 10 square feet, consider hiring an [employer] examiner For clothing, washing  with soap and water is best. If moldy items cannot be cleaned and dried, throw them away. Remove sources e.g. contaminated carpets. Repair and seal leaking roofs or pipes. Using dehumidifiers in damp basements may  be helpful, but empty the water and clean units regularly to prevent mildew from forming. All rooms, especially basements, bathrooms and kitchens, require ventilation and cleaning to deter mold and mildew growth. Avoid carpeting on concrete or damp floors, and storing items in damp areas. Control of House Dust Mite Allergen Dust mite allergens are a common trigger of allergy and asthma symptoms. While they can be found throughout the house, these microscopic creatures thrive in warm, humid environments such as bedding, upholstered furniture and carpeting. Because so much time is spent in the bedroom, it is essential to reduce mite levels there.  Encase pillows, mattresses, and box springs in special allergen-proof fabric covers or airtight, zippered plastic covers.  Bedding should be washed weekly in hot water (130 F) and dried in a hot dryer. Allergen-proof covers are available for comforters and pillows that can't be regularly washed.  Wash the allergy-proof covers every few months. Minimize clutter in the bedroom. Keep pets out of the bedroom.  Keep humidity less than 50% by using a dehumidifier or air conditioning. You can buy a humidity measuring device called a hygrometer to monitor this.  If possible, replace carpets with hardwood, linoleum, or washable area rugs. If that's not possible, vacuum frequently with a vacuum that has a HEPA filter. Remove all upholstered furniture and non-washable window drapes from the bedroom. Remove all non-washable stuffed toys from the bedroom.  Wash stuffed toys weekly. Pet Allergen Avoidance: Contrary to popular opinion, there are no "hypoallergenic" breeds of dogs or cats. That is because people are not allergic to an animal's hair, but to an allergen found in the animal's saliva, dander (dead skin flakes) or urine. Pet allergy symptoms typically occur within minutes. For some people, symptoms can build up and become most severe 8 to 12 hours after contact with  the animal. People with severe allergies can experience reactions in public places if dander has been transported on the pet owners' clothing. Keeping an animal outdoors is only a partial solution, since homes with pets in the yard still have higher concentrations of animal allergens. Before getting a pet, ask your allergist to determine if you are allergic to animals. If your pet is already considered part of your family, try to minimize contact and keep the pet out of the bedroom and other rooms where you spend a great deal of time. As with dust mites, vacuum carpets often or replace carpet with a hardwood floor, tile or linoleum. High-efficiency particulate air (HEPA) cleaners can reduce allergen levels over time. While dander and saliva are the source of cat and dog allergens, urine is the source of allergens from rabbits, hamsters, mice and [place] pigs; so ask a non-allergic family member to clean the animal's cage. If you have a pet allergy, talk to your allergist about the potential for allergy immunotherapy (allergy shots). This strategy can often provide long-term relief. Cockroach Allergen Avoidance Cockroaches are often found in the homes of densely populated urban areas, schools or commercial buildings, but these creatures can lurk almost anywhere. This does not mean that you have a dirty house or living area. Block all areas where roaches can enter the home. This includes crevices, wall cracks and windows.  Cockroaches need water to survive, so fix and seal all leaky faucets and  pipes. Have an exterminator go through the house when your family and pets are gone to eliminate any remaining roaches. Keep food in lidded containers and put pet food dishes away after your pets are done eating. Vacuum and sweep the floor after meals, and take out garbage and recyclables. Use lidded garbage containers in the kitchen. Wash dishes immediately after use and clean under stoves, refrigerators or toasters  where crumbs can accumulate. Wipe off the stove and other kitchen surfaces and cupboards regularly.  Skin care recommendations  Bath time: Always use lukewarm water. AVOID very hot or cold water. Keep bathing time to 5-10 minutes. Do NOT use bubble bath. Use a mild soap and use just enough to wash the dirty areas. Do NOT scrub skin vigorously.  After bathing, pat dry your skin with a towel. Do NOT rub or scrub the skin.  Moisturizers and prescriptions:  ALWAYS apply moisturizers immediately after bathing (within 3 minutes). This helps to lock-in moisture. Use the moisturizer several times a day over the whole body. Good summer moisturizers include: Aveeno, CeraVe, Cetaphil. Good winter moisturizers include: Aquaphor, Vaseline, Cerave, Cetaphil, Eucerin, Vanicream. When using moisturizers along with medications, the moisturizer should be applied about one hour after applying the medication to prevent diluting effect of the medication or moisturize around where you applied the medications. When not using medications, the moisturizer can be continued twice daily as maintenance.  Laundry and clothing: Avoid laundry products with added color or perfumes. Use unscented hypo-allergenic laundry products such as Tide free, Cheer free & gentle, and All free and clear.  If the skin still seems dry or sensitive, you can try double-rinsing the clothes. Avoid tight or scratchy clothing such as wool. Do not use fabric softeners or dyer sheets.

## 2021-08-31 NOTE — Assessment & Plan Note (Signed)
Past history - Episodes of shortness of breath, chest tightness, coughing and wheezing for the past 44 years.  Patient used to have an albuterol in the past but has not used one for 4 years.  Recently noted excessive shortness of breath with any kind of exertion and inability to exhale fully.  Apparently had recent normal EKG.  No previous cardiac evaluation.  History of pneumonias in the past and there was a questionable COPD diagnosis as well. Last spirometry showed mixed restriction and mild obstruction with no improvement in FEV1 post bronchodilator treatment.  Patient clinically felt unchanged with Xopenex treatment. Tried Symbicort with no benefit.  Interim history - failed follow up as recommended. Shortness of breath unchanged, no inhalers at home. Gained 20+lbs and has some anxiety as well. . Today's spirometry showed: possible restrictive disease with 8% improvement in FEV1 post bronchodilator treatment. Clinically feeling slightly improved.   . Daily controller medication(s): START Breo 125mg 1 puff once a day and rinse mouth after each use. Demonstrated proper use.  . May use albuterol rescue inhaler 2 puffs every 4 to 6 hours as needed for shortness of breath, chest tightness, coughing, and wheezing. May use albuterol rescue inhaler 2 puffs 5 to 15 minutes prior to strenuous physical activities. Monitor frequency of use.  . Get spirometry at next visit. .Marland KitchenDiscussed that sometimes anxiety can cause shortness of breath sensation as well - recommend following up with PCP regarding anxiety.

## 2021-08-31 NOTE — Assessment & Plan Note (Signed)
.   See below for proper skin care.

## 2021-08-31 NOTE — Assessment & Plan Note (Addendum)
Past history - Perennial rhinoconjunctivitis symptoms for the last 10+ years.  2020 skin testing showed: Positive to grass, weed, ragweed, trees, mold, dust mites, dog, cat, cockroach. 1 cat at home.  Interim history - not controlled symptoms.   Start environmental control measures as below.  Use over the counter antihistamines such as Zyrtec (cetirizine), Claritin (loratadine), Allegra (fexofenadine), or Xyzal (levocetirizine) daily as needed. May take twice a day during allergy flares. May switch antihistamines every few months.  Use Nasacort (triamcinolone) nasal spray 1 spray per nostril twice a day as needed for nasal congestion. Samples given.  Use azelastine nasal spray 1-2 sprays per nostril twice a day as needed for runny nose/drainage. . Use olopatadine eye drops 0.2% once a day as needed for itchy/watery eyes.  Consider allergy injections for long term control if above medications do not help the symptoms - handout given.

## 2021-09-01 ENCOUNTER — Encounter: Payer: Self-pay | Admitting: Family

## 2021-09-01 ENCOUNTER — Telehealth: Payer: Self-pay | Admitting: Allergy

## 2021-09-01 MED ORDER — FLUTICASONE-SALMETEROL 250-50 MCG/ACT IN AEPB: 1.0000 | INHALATION_SPRAY | Freq: Two times a day (BID) | RESPIRATORY_TRACT | 3 refills | Status: DC

## 2021-09-01 NOTE — Telephone Encounter (Signed)
Please call patient.  Sent in Advair diskus 250 mcg 1 puff twice a day and rinse mouth after each use.  If that is too expensive then he can check with pharmacy and ask if any of the following inhalers are cheaper: Symbicort, Dulera, Advair HFA, Wixela, Airduo digihaler or Airduo respiclick.

## 2021-09-01 NOTE — Telephone Encounter (Signed)
Patient states he went to pick up his breo, however it is way too expensive for him. Patient states with insurance it comes to about $300. Patient is wondering if something else could be sent in for him.   [place], [place] [id number]   Best contact number: [phone]

## 2021-09-02 ENCOUNTER — Encounter: Payer: Self-pay | Admitting: Family Medicine

## 2021-09-02 ENCOUNTER — Other Ambulatory Visit: Payer: Self-pay

## 2021-09-02 ENCOUNTER — Ambulatory Visit (INDEPENDENT_AMBULATORY_CARE_PROVIDER_SITE_OTHER): Payer: 59 | Admitting: Family Medicine

## 2021-09-02 VITALS — BP 120/84 | HR 84 | Temp 97.9°F | Wt 250.8 lb

## 2021-09-02 DIAGNOSIS — E559 Vitamin D deficiency, unspecified: Secondary | ICD-10-CM

## 2021-09-02 DIAGNOSIS — M2550 Pain in unspecified joint: Secondary | ICD-10-CM | POA: Diagnosis not present

## 2021-09-02 DIAGNOSIS — M79602 Pain in left arm: Secondary | ICD-10-CM

## 2021-09-02 DIAGNOSIS — Z23 Encounter for immunization: Secondary | ICD-10-CM

## 2021-09-02 DIAGNOSIS — M722 Plantar fascial fibromatosis: Secondary | ICD-10-CM

## 2021-09-02 DIAGNOSIS — M25512 Pain in left shoulder: Secondary | ICD-10-CM

## 2021-09-02 DIAGNOSIS — G4733 Obstructive sleep apnea (adult) (pediatric): Secondary | ICD-10-CM | POA: Diagnosis not present

## 2021-09-02 DIAGNOSIS — M62838 Other muscle spasm: Secondary | ICD-10-CM

## 2021-09-02 LAB — CBC WITH DIFFERENTIAL/PLATELET
Basophils Absolute: 0 10*3/uL (ref 0.0–0.1)
Basophils Relative: 0.3 % (ref 0.0–3.0)
Eosinophils Absolute: 0.1 10*3/uL (ref 0.0–0.7)
Eosinophils Relative: 3 % (ref 0.0–5.0)
HCT: 42.1 % (ref 39.0–52.0)
Hemoglobin: 14.1 g/dL (ref 13.0–17.0)
Lymphocytes Relative: 38.3 % (ref 12.0–46.0)
Lymphs Abs: 1.9 10*3/uL (ref 0.7–4.0)
MCHC: 33.5 g/dL (ref 30.0–36.0)
MCV: 95.4 fl (ref 78.0–100.0)
Monocytes Absolute: 0.3 10*3/uL (ref 0.1–1.0)
Monocytes Relative: 6.5 % (ref 3.0–12.0)
Neutro Abs: 2.6 10*3/uL (ref 1.4–7.7)
Neutrophils Relative %: 51.9 % (ref 43.0–77.0)
Platelets: 199 10*3/uL (ref 150.0–400.0)
RBC: 4.42 Mil/uL (ref 4.22–5.81)
RDW: 13.1 % (ref 11.5–15.5)
WBC: 5 10*3/uL (ref 4.0–10.5)

## 2021-09-02 LAB — BASIC METABOLIC PANEL
BUN: 10 mg/dL (ref 6–23)
CO2: 25 mEq/L (ref 19–32)
Calcium: 9.2 mg/dL (ref 8.4–10.5)
Chloride: 102 mEq/L (ref 96–112)
Creatinine, Ser: 0.82 mg/dL (ref 0.40–1.50)
GFR: 105.25 mL/min (ref >60.00)
Glucose, Bld: 78 mg/dL (ref 70–99)
Potassium: 4.2 mEq/L (ref 3.5–5.1)
Sodium: 137 mEq/L (ref 135–145)

## 2021-09-02 LAB — VITAMIN D 25 HYDROXY (VIT D DEFICIENCY, FRACTURES): VITD: 10.34 ng/mL — ABNORMAL LOW (ref 30.00–100.00)

## 2021-09-02 LAB — VITAMIN B12: Vitamin B-12: 300 pg/mL (ref 211–911)

## 2021-09-02 LAB — FOLATE: Folate: 11.4 ng/mL (ref >5.9)

## 2021-09-02 LAB — SEDIMENTATION RATE: Sed Rate: 21 mm/hr — ABNORMAL HIGH (ref 0–15)

## 2021-09-02 MED ORDER — VITAMIN D (ERGOCALCIFEROL) 1.25 MG (50000 UNIT) PO CAPS: 50000.0000 [IU] | ORAL_CAPSULE | ORAL | 0 refills | Status: DC

## 2021-09-02 NOTE — Progress Notes (Signed)
Subjective:    Patient ID: William Bowman, male    DOB: October 18, 1976, 45 y.o.   MRN: [id number]  Chief Complaint  Patient presents with   Pain    Slight pain in left arm, allergist suggested he get an EKG to make sure it is not his heart and lungs.pain in under arm about 3 weeks    HPI Patient was seen today for ongoing concern.  Patient endorses being seen by allergist who is requesting an EKG as patient is having left posterior shoulder pain with radiation into LUE and muscle spasms in LUE x3 weeks.  Per patient wants to make sure his heart is okay.  Patient states that discomfort is a dull pulling/spasm, 3/10.  Still having the sensation that he cannot taking a good deep breath.  Had sleep study.  Mild OSA noted.  Patient also endorses constant pain in bottom of feet.  Initially started in the heel of right foot now also in the middle of the left foot.  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  Past Medical History:  Diagnosis Date   Allergy    Anxiety    Asthma    Depression    Eczema    Pneumonia     No Known Allergies  ROS General: Denies fever, chills, night sweats, changes in weight, changes in appetite HEENT: Denies headaches, ear pain, changes in vision, rhinorrhea, sore throat CV: Denies CP, palpitations, SOB, orthopnea Pulm: Denies SOB, cough, wheezing GI: Denies abdominal pain, nausea, vomiting, diarrhea, constipation GU: Denies dysuria, hematuria, frequency Msk: Denies muscle cramps, joint pains +b/l foot pain, L posterior shoulder pain and upper arm pain Neuro: Denies weakness, numbness, tingling Skin: Denies rashes, bruising Psych: Denies depression, hallucinations  +anxiety     Objective:    Blood pressure 120/84, pulse 84, temperature 97.9 F (36.6 C), temperature source Oral, weight 250 lb 12.8 oz (113.8 kg), SpO2 95 %. Body mass index is 39.28 kg/m.   Gen. Pleasant, well-nourished, in no distress, normal affect   HEENT: [place]/AT, face symmetric,  conjunctiva clear, no scleral icterus, PERRLA, EOMI, nares patent without drainage Lungs: no accessory muscle use, CTAB, no wheezes or rales Cardiovascular: RRR, no m/r/g, no peripheral edema Musculoskeletal: No TTP of cervical, thoracic, lumbar spine, paraspinal muscles.  No TTP of L shoulder or upper arm. No deformities, no cyanosis or clubbing, normal tone Neuro:  A&Ox3, CN II-XII intact, normal gait Skin:  Warm, no lesions/ rash  Wt Readings from Last 3 Encounters:  09/02/21 250 lb 12.8 oz (113.8 kg)  08/31/21 249 lb 9 oz (113.2 kg)  04/13/21 250 lb (113.4 kg)    Lab Results  Component Value Date   WBC 5.8 10/14/2019   HGB 13.9 10/14/2019   HCT 40.8 10/14/2019   PLT 225.0 10/14/2019   GLUCOSE 89 10/14/2019   CHOL 205 (H) 01/30/2018   TRIG 94.0 01/30/2018   HDL 48.70 01/30/2018   LDLCALC 137 (H) 01/30/2018   ALT 20 10/14/2019   AST 17 10/14/2019   NA 134 (L) 10/14/2019   K 3.9 10/14/2019   CL 100 10/14/2019   CREATININE 0.78 10/14/2019   BUN 12 10/14/2019   CO2 25 10/14/2019   TSH 0.76 01/30/2018   INR 1.1 (H) 10/14/2019    Assessment/Plan:  Left shoulder pain, unspecified chronicity -discussed possible causes -supportive care -EKG this visit NSR.  Nonspecific T wave inversion and low voltage in V2. EKG form 10/14/19 reviewed for comparison. -continue to monitor -Given precautions for  continued or worsening symptoms. - Plan: EKG 12-Lead, CBC  Left arm pain - Plan: EKG 12-Lead, CBC  Plantar fasciitis -Stretching exercises and other supportive care encouraged -Given handouts -For continued or worsening symptoms consider referral to podiatry for injection  OSA (obstructive sleep apnea) -Mild OSA noted on sleep study -Lifestyle modifications including weight loss strongly encouraged -Can consider trial of CPAP  Need for influenza vaccine -Influenza vaccine given this visit  Muscle spasm -Plan: CMP, TSH, free T4, vitamin B12, folate, CBC  Vitamin D  deficiency -Recheck vitamin D -Vitamin D 9.983 years ago -Plan: Vitamin D, 25-hydroxy  Multiple joint pain -Previously elevated ANA without follow-up -Repeat autoimmune testing including ANA, SLE, ESR -For continued elevation referred to rheumatology  F/u as needed in 1 month, sooner if needed  Update: -Vitamin D noted low at 10.34.  Rx for ergocalciferol 50,000 units weekly sent to patient's pharmacy x12 weeks.  Vitamin B12 low normal at 300.  Consider OTC vitamin B12 supplement.  Sed rate elevated at 21.  SLE panel pending.  Grier Mitts, MD

## 2021-09-02 NOTE — Patient Instructions (Signed)
Your EKG was normal.

## 2021-09-05 LAB — LUPUS (SLE) ANALYSIS
Anti Nuclear Antibody (ANA): NEGATIVE
Complement C4, Serum: 36 mg/dL (ref 12–38)
ENA RNP Ab: 0.5 AI (ref 0.0–0.9)
ENA SM Ab Ser-aCnc: <0.2 AI (ref 0.0–0.9)
ENA SSA (RO) Ab: <0.2 AI (ref 0.0–0.9)
ENA SSB (LA) Ab: <0.2 AI (ref 0.0–0.9)
Mitochondrial Ab: <20 Units (ref 0.0–20.0)
Parietal Cell Ab: 1.2 Units (ref 0.0–20.0)
Scleroderma (Scl-70) (ENA) Antibody, IgG: <0.2 AI (ref 0.0–0.9)
Smooth Muscle Ab: 6 Units (ref 0–19)
Thyroperoxidase Ab SerPl-aCnc: <8 IU/mL (ref 0–34)
dsDNA Ab: <1 IU/mL (ref 0–9)

## 2021-10-03 ENCOUNTER — Ambulatory Visit: Payer: 59 | Admitting: Family Medicine

## 2021-10-10 ENCOUNTER — Ambulatory Visit: Payer: 59 | Admitting: Family Medicine

## 2021-11-02 ENCOUNTER — Encounter: Payer: Self-pay | Admitting: Allergy

## 2021-11-02 ENCOUNTER — Other Ambulatory Visit: Payer: Self-pay

## 2021-11-02 ENCOUNTER — Ambulatory Visit (INDEPENDENT_AMBULATORY_CARE_PROVIDER_SITE_OTHER): Payer: 59 | Admitting: Allergy

## 2021-11-02 VITALS — BP 122/94 | HR 89 | Temp 98.0°F | Resp 18

## 2021-11-02 DIAGNOSIS — J45909 Unspecified asthma, uncomplicated: Secondary | ICD-10-CM | POA: Diagnosis not present

## 2021-11-02 DIAGNOSIS — J3089 Other allergic rhinitis: Secondary | ICD-10-CM | POA: Diagnosis not present

## 2021-11-02 DIAGNOSIS — H1013 Acute atopic conjunctivitis, bilateral: Secondary | ICD-10-CM

## 2021-11-02 DIAGNOSIS — J302 Other seasonal allergic rhinitis: Secondary | ICD-10-CM

## 2021-11-02 DIAGNOSIS — H1010 Acute atopic conjunctivitis, unspecified eye: Secondary | ICD-10-CM

## 2021-11-02 MED ORDER — OLOPATADINE HCL 0.2 % OP SOLN: 1.0000 [drp] | Freq: Every day | OPHTHALMIC | 5 refills | Status: DC | PRN

## 2021-11-02 MED ORDER — FLUTICASONE-SALMETEROL 500-50 MCG/ACT IN AEPB: 1.0000 | INHALATION_SPRAY | Freq: Two times a day (BID) | RESPIRATORY_TRACT | 5 refills | Status: DC

## 2021-11-02 NOTE — Patient Instructions (Addendum)
Breathing:  Daily controller medication(s): INCREASE Advair to 534mcg 1 puff twice a day and rinse mouth after each use.  May use albuterol rescue inhaler 2 puffs every 4 to 6 hours as needed for shortness of breath, chest tightness, coughing, and wheezing. May use albuterol rescue inhaler 2 puffs 5 to 15 minutes prior to strenuous physical activities. Monitor frequency of use.  Asthma control goals:  Full participation in all desired activities (may need albuterol before activity) Albuterol use two times or less a week on average (not counting use with activity) Cough interfering with sleep two times or less a month Oral steroids no more than once a year No hospitalizations   Environmental allergies 2020 skin testing showed: Positive to grass, weed, ragweed, trees, mold, dust mites, dog, cat, cockroach. Continue environmental control measures as below. Use over the counter antihistamines such as Zyrtec (cetirizine), Claritin (loratadine), Allegra (fexofenadine), or Xyzal (levocetirizine) daily as needed. May take twice a day during allergy flares. May switch antihistamines every few months. Use Nasacort (triamcinolone) nasal spray 1 spray per nostril twice a day as needed for nasal congestion.  Use azelastine nasal spray 1-2 sprays per nostril twice a day as needed for runny nose/drainage. Use olopatadine eye drops 0.2% once a day as needed for itchy/watery eyes. Consider allergy injections for long term control if above medications do not help the symptoms - handout given.   Skin: Continue proper skin care.   Follow up in 3 months or sooner if needed.

## 2021-11-02 NOTE — Assessment & Plan Note (Signed)
Past history - Episodes of shortness of breath, chest tightness, coughing and wheezing for the past 44 years.  Patient used to have an albuterol in the past but has not used one for 4 years.  Recently noted excessive shortness of breath with any kind of exertion and inability to exhale fully.  Apparently had recent normal EKG.  No previous cardiac evaluation.  History of pneumonias in the past and there was a questionable COPD diagnosis as well. Last spirometry showed mixed restriction and mild obstruction with no improvement in FEV1 post bronchodilator treatment.  Patient clinically felt unchanged with Xopenex treatment. Tried Symbicort with no benefit. Breo not covered. Interim history - breathing is better with Advair.   Today's spirometry showed some restriction. . Daily controller medication(s): INCREASE Advair to 513mcg 1 puff twice a day and rinse mouth after each use.  . May use albuterol rescue inhaler 2 puffs every 4 to 6 hours as needed for shortness of breath, chest tightness, coughing, and wheezing. May use albuterol rescue inhaler 2 puffs 5 to 15 minutes prior to strenuous physical activities. Monitor frequency of use.  . Get spirometry at next visit. Marland Kitchen Discussed that sometimes anxiety can cause shortness of breath sensation as well - recommend following up with PCP regarding anxiety.

## 2021-11-02 NOTE — Assessment & Plan Note (Signed)
>>  ASSESSMENT AND PLAN FOR ASTHMA WRITTEN ON 11/02/2021 12:24 PM BY Garnet Sierras, DO  Past history - Episodes of shortness of breath, chest tightness, coughing and wheezing for the past 44 years.  Patient used to have an albuterol in the past but has not used one for 4 years.  Recently noted excessive shortness of breath with any kind of exertion and inability to exhale fully.  Apparently had recent normal EKG.  No previous cardiac evaluation.  History of pneumonias in the past and there was a questionable COPD diagnosis as well. Last spirometry showed mixed restriction and mild obstruction with no improvement in FEV1 post bronchodilator treatment.  Patient clinically felt unchanged with Xopenex treatment. Tried Symbicort with no benefit. Breo not covered. Interim history - breathing is better with Advair.   Today's spirometry showed some restriction. . Daily controller medication(s): INCREASE Advair to 528mcg 1 puff twice a day and rinse mouth after each use.  . May use albuterol rescue inhaler 2 puffs every 4 to 6 hours as needed for shortness of breath, chest tightness, coughing, and wheezing. May use albuterol rescue inhaler 2 puffs 5 to 15 minutes prior to strenuous physical activities. Monitor frequency of use.  . Get spirometry at next visit. Marland Kitchen Discussed that sometimes anxiety can cause shortness of breath sensation as well - recommend following up with PCP regarding anxiety.

## 2021-11-02 NOTE — Progress Notes (Signed)
Follow Up Note  RE: William Bowman MRN: [id number] DOB: February 12, 1976 Date of Office Visit: 11/02/2021  Referring provider: Billie Ruddy, MD Primary care provider: Billie Ruddy, MD  Chief Complaint: Follow-up  History of Present Illness: I had the pleasure of seeing William Bowman for a follow up visit at the [facility] on 11/02/2021. William Bowman is a 45 y.o. male, who is being followed for shortness of breath, allergic rhino conjunctivitis. His previous allergy office visit was on 08/31/2021 with Dr. Maudie Mercury. Today is a regular follow up visit.  Breathing  Currently on Advair 234mcg 1 puff BID x 1.5 month with some benefit.  William Bowman noted that William Bowman is able to take a deeper breath in now.   Only had to use albuterol once the past 3 weeks while William Bowman was at work and exerting himself.  Anxiety is unchanged and tried Zoloft with unknown benefit. William Bowman has been trying to walk daily and work on anxiety.   Breo was not covered.   Seasonal and perennial allergic rhinoconjunctivitis Currently taking zyrtec 10mg  daily, Nasacort 1-2 sprays per nostril daily with no nosebleeds. Symptoms controlled with above regimen. Patient will call insurance about coverage.   Assessment and Plan: William Bowman is a 45 y.o. male with: Asthma Past history - Episodes of shortness of breath, chest tightness, coughing and wheezing for the past 44 years.  Patient used to have an albuterol in the past but has not used one for 4 years.  Recently noted excessive shortness of breath with any kind of exertion and inability to exhale fully.  Apparently had recent normal EKG.  No previous cardiac evaluation.  History of pneumonias in the past and there was a questionable COPD diagnosis as well. Last spirometry showed mixed restriction and mild obstruction with no improvement in FEV1 post bronchodilator treatment.  Patient clinically felt unchanged with Xopenex treatment. Tried Symbicort with no benefit. Breo not  covered. Interim history - breathing is better with Advair.  Today's spirometry showed some restriction. Daily controller medication(s): INCREASE Advair to 569mcg 1 puff twice a day and rinse mouth after each use.  May use albuterol rescue inhaler 2 puffs every 4 to 6 hours as needed for shortness of breath, chest tightness, coughing, and wheezing. May use albuterol rescue inhaler 2 puffs 5 to 15 minutes prior to strenuous physical activities. Monitor frequency of use.  Get spirometry at next visit. Discussed that sometimes anxiety can cause shortness of breath sensation as well - recommend following up with PCP regarding anxiety.  Seasonal and perennial allergic rhinoconjunctivitis Past history - Perennial rhinoconjunctivitis symptoms for the last 10+ years.  2020 skin testing showed: Positive to grass, weed, ragweed, trees, mold, dust mites, dog, cat, cockroach. 1 cat at home.  Interim history - symptoms controlled.  Continue environmental control measures as below. Use over the counter antihistamines such as Zyrtec (cetirizine), Claritin (loratadine), Allegra (fexofenadine), or Xyzal (levocetirizine) daily as needed. May take twice a day during allergy flares. May switch antihistamines every few months. Use Nasacort (triamcinolone) nasal spray 1 spray per nostril twice a day as needed for nasal congestion.  Use azelastine nasal spray 1-2 sprays per nostril twice a day as needed for runny nose/drainage. Use olopatadine eye drops 0.2% once a day as needed for itchy/watery eyes. Consider allergy injections for long term control if above medications do not help the symptoms - handout given.   Return in about 3 months (around 02/02/2022).  Meds ordered this encounter  Medications  Olopatadine HCl 0.2 % SOLN    Sig: Apply 1 drop to eye daily as needed (itchy/watery eyes).    Dispense:  2.5 mL    Refill:  5   fluticasone-salmeterol (ADVAIR DISKUS) 500-50 MCG/ACT AEPB    Sig: Inhale 1 puff into  the lungs in the morning and at bedtime. Rinse mouth after each use.    Dispense:  60 each    Refill:  5    Lab Orders  No laboratory test(s) ordered today    Diagnostics: Spirometry:  Tracings reviewed. His effort: Good reproducible efforts. FVC: 2.15L FEV1: 1.58L, 51% predicted FEV1/FVC ratio: 73% Interpretation: Spirometry consistent with possible restrictive disease.  Please see scanned spirometry results for details.  Medication List:  Current Outpatient Medications  Medication Sig Dispense Refill   albuterol (PROAIR HFA) 108 (90 Base) MCG/ACT inhaler Inhale 2 puffs into the lungs every 4 (four) hours as needed for wheezing or shortness of breath (coughing fits). 18 g 1   azelastine (ASTELIN) 0.1 % nasal spray Place 1-2 sprays into both nostrils 2 (two) times daily as needed (nasal drainage). Use in each nostril as directed 30 mL 5   cetirizine (ZYRTEC) 10 MG tablet Take 10 mg by mouth daily as needed for allergies.     fluticasone-salmeterol (ADVAIR DISKUS) 500-50 MCG/ACT AEPB Inhale 1 puff into the lungs in the morning and at bedtime. Rinse mouth after each use. 60 each 5   gabapentin (NEURONTIN) 100 MG capsule TAKE 2 CAPSULES BY MOUTH AT BEDTIME 60 capsule 0   triamcinolone ointment (KENALOG) 0.5 % Apply 1 application topically 2 (two) times daily. 30 g 7   Vitamin D, Ergocalciferol, (DRISDOL) 1.25 MG (50000 UNIT) CAPS capsule Take 1 capsule (50,000 Units total) by mouth every 7 (seven) days. 12 capsule 0   Olopatadine HCl 0.2 % SOLN Apply 1 drop to eye daily as needed (itchy/watery eyes). 2.5 mL 5   sertraline (ZOLOFT) 25 MG tablet Take 1 tablet (25 mg total) by mouth daily. (Patient not taking: Reported on 11/02/2021) 30 tablet 2   terbinafine (LAMISIL) 250 MG tablet Take 1 tablet by mouth once daily (Patient not taking: No sig reported) 30 tablet 0   No current facility-administered medications for this visit.   Allergies: No Known Allergies I reviewed his past medical  history, social history, family history, and environmental history and no significant changes have been reported from his previous visit.  Review of Systems  Constitutional:  Negative for appetite change, chills, fever and unexpected weight change.  HENT:  Negative for congestion, postnasal drip and rhinorrhea.   Eyes:  Negative for itching.  Respiratory:  Positive for shortness of breath. Negative for cough, chest tightness and wheezing.   Gastrointestinal:  Negative for abdominal pain.  Skin:  Negative for rash.  Allergic/Immunologic: Positive for environmental allergies.  Neurological:  Negative for headaches.   Objective: BP (!) 122/94 (BP Location: Left Arm, Patient Position: Sitting, Cuff Size: Normal)   Pulse 89   Temp 98 F (36.7 C) (Temporal)   Resp 18   SpO2 98%  There is no height or weight on file to calculate BMI. Physical Exam Vitals and nursing note reviewed.  Constitutional:      Appearance: Normal appearance. William Bowman is well-developed. William Bowman is obese.  HENT:     Head: Normocephalic and atraumatic.     Right Ear: Tympanic membrane and external ear normal.     Left Ear: Tympanic membrane and external ear normal.  Nose: Nose normal.     Mouth/Throat:     Mouth: Mucous membranes are moist.     Pharynx: Oropharynx is clear.  Eyes:     Conjunctiva/sclera: Conjunctivae normal.  Cardiovascular:     Rate and Rhythm: Normal rate and regular rhythm.     Heart sounds: Normal heart sounds. No murmur heard. Pulmonary:     Effort: Pulmonary effort is normal.     Breath sounds: Normal breath sounds. No wheezing, rhonchi or rales.  Musculoskeletal:     Cervical back: Neck supple.  Skin:    General: Skin is warm and dry.     Findings: No rash.  Neurological:     Mental Status: William Bowman is alert and oriented to person, place, and time.  Psychiatric:        Behavior: Behavior normal.  Previous notes and tests were reviewed. The plan was reviewed with the patient/family, and all  questions/concerned were addressed.  It was my pleasure to see Larico today and participate in his care. Please feel free to contact me with any questions or concerns.  Sincerely,  Rexene Alberts, DO Allergy & Immunology  [facility] office: [place] office: [phone]

## 2021-11-02 NOTE — Assessment & Plan Note (Signed)
Past history - Perennial rhinoconjunctivitis symptoms for the last 10+ years.  2020 skin testing showed: Positive to grass, weed, ragweed, trees, mold, dust mites, dog, cat, cockroach. 1 cat at home.  Interim history - symptoms controlled.   Continue environmental control measures as below.  Use over the counter antihistamines such as Zyrtec (cetirizine), Claritin (loratadine), Allegra (fexofenadine), or Xyzal (levocetirizine) daily as needed. May take twice a day during allergy flares. May switch antihistamines every few months.  Use Nasacort (triamcinolone) nasal spray 1 spray per nostril twice a day as needed for nasal congestion.   Use azelastine nasal spray 1-2 sprays per nostril twice a day as needed for runny nose/drainage. . Use olopatadine eye drops 0.2% once a day as needed for itchy/watery eyes.  Consider allergy injections for long term control if above medications do not help the symptoms - handout given.

## 2021-11-26 ENCOUNTER — Other Ambulatory Visit: Payer: Self-pay | Admitting: Family Medicine

## 2021-11-26 DIAGNOSIS — E559 Vitamin D deficiency, unspecified: Secondary | ICD-10-CM

## 2022-02-06 ENCOUNTER — Ambulatory Visit: Payer: 59 | Admitting: Allergy

## 2022-08-04 ENCOUNTER — Ambulatory Visit (INDEPENDENT_AMBULATORY_CARE_PROVIDER_SITE_OTHER): Payer: Self-pay | Admitting: Family Medicine

## 2022-08-04 ENCOUNTER — Encounter: Payer: Self-pay | Admitting: Family Medicine

## 2022-08-04 VITALS — BP 132/90 | HR 98 | Temp 98.7°F | Wt 258.0 lb

## 2022-08-04 DIAGNOSIS — F489 Nonpsychotic mental disorder, unspecified: Secondary | ICD-10-CM

## 2022-08-04 DIAGNOSIS — F321 Major depressive disorder, single episode, moderate: Secondary | ICD-10-CM

## 2022-08-04 DIAGNOSIS — F17210 Nicotine dependence, cigarettes, uncomplicated: Secondary | ICD-10-CM

## 2022-08-04 DIAGNOSIS — K047 Periapical abscess without sinus: Secondary | ICD-10-CM

## 2022-08-04 DIAGNOSIS — F411 Generalized anxiety disorder: Secondary | ICD-10-CM

## 2022-08-04 DIAGNOSIS — K029 Dental caries, unspecified: Secondary | ICD-10-CM

## 2022-08-04 MED ORDER — CYCLOBENZAPRINE HCL 10 MG PO TABS: 10.0000 mg | ORAL_TABLET | Freq: Three times a day (TID) | ORAL | 0 refills | Status: DC | PRN

## 2022-08-04 MED ORDER — AMOXICILLIN 500 MG PO TABS: 500.0000 mg | ORAL_TABLET | Freq: Two times a day (BID) | ORAL | 0 refills | Status: AC

## 2022-08-04 MED ORDER — BUPROPION HCL ER (XL) 150 MG PO TB24: 150.0000 mg | ORAL_TABLET | Freq: Every day | ORAL | 4 refills | Status: DC

## 2022-08-04 NOTE — Progress Notes (Signed)
Subjective:    Patient ID: William Bowman, male    DOB: Oct 09, 1976, 46 y.o.   MRN: [id number]  Chief Complaint  Patient presents with   Temporomandibular Joint Pain    Started 2 weeks ago, takes gabapentin when necessary at night.  Take ibuprofen, naproxen but does not help. Not constant, sporatic pain, possibly stressed, pain wakes him up. Pulling muscles in neck down shoulders, hyperventilating, left side behind eye, and ear, and jaw will be tight.     HPI Patient was seen today for several ongoing concerns.  Patient endorses intermittent facial pain in front of L ear x 2 wks.  Pt unsure if due to TMJ.  OTC pain relievers have not helped.  Denies facial edema.  Patient endorses increased anxiety.  Ready to consider medication options.  States has a difficult time relaxing when just sitting on the couch.  Notes pain in shoulders/neck.  Pt states he is still smoking cigarettes but less than before.  Past Medical History:  Diagnosis Date   Allergy    Anxiety    Asthma    Depression    Eczema    Pneumonia     No Known Allergies  ROS General: Denies fever, chills, night sweats, changes in weight, changes in appetite HEENT: Denies headaches, ear pain, changes in vision, rhinorrhea, sore throat +L sided facial pain CV: Denies CP, palpitations, SOB, orthopnea Pulm: Denies SOB, cough, wheezing GI: Denies abdominal pain, nausea, vomiting, diarrhea, constipation GU: Denies dysuria, hematuria, frequency Msk: Denies muscle cramps, joint pains  +neck/shoulder pain Neuro: Denies weakness, numbness, tingling Skin: Denies rashes, bruising Psych: Denies depression, anxiety, hallucinations  +anxiety    Objective:    Blood pressure (!) 132/90, pulse 98, temperature 98.7 F (37.1 C), temperature source Oral, weight 258 lb (117 kg), SpO2 94 %.  Gen. Pleasant, well-nourished, in no distress, normal affect   HEENT: [place]/AT, face symmetric, conjunctiva clear, no scleral icterus, PERRLA, EOMI, nares  patent without drainage, dental caries noted.  Dental abscess of left mandibular gingiva.  No TTP of bilateral TMJ.  No jaw pain with opening and closing mouth.  Pharynx without erythema or exudate.  TMs normal bilaterally.  No cervical lymphadenopathy. Lungs: no accessory muscle use, CTAB, no wheezes or rales Cardiovascular: RRR, no m/r/g, no peripheral edema Musculoskeletal: Increased muscle tension in bilateral trapezius muscles.  No deformities, no cyanosis or clubbing, normal tone Neuro:  A&Ox3, CN II-XII intact, normal gait   Wt Readings from Last 3 Encounters:  09/02/21 250 lb 12.8 oz (113.8 kg)  08/31/21 249 lb 9 oz (113.2 kg)  04/13/21 250 lb (113.4 kg)    Lab Results  Component Value Date   WBC 5.0 09/02/2021   HGB 14.1 09/02/2021   HCT 42.1 09/02/2021   PLT 199.0 09/02/2021   GLUCOSE 78 09/02/2021   CHOL 205 (H) 01/30/2018   TRIG 94.0 01/30/2018   HDL 48.70 01/30/2018   LDLCALC 137 (H) 01/30/2018   ALT 20 10/14/2019   AST 17 10/14/2019   NA 137 09/02/2021   K 4.2 09/02/2021   CL 102 09/02/2021   CREATININE 0.82 09/02/2021   BUN 10 09/02/2021   CO2 25 09/02/2021   TSH 0.76 01/30/2018   INR 1.1 (H) 10/14/2019      08/04/2022   11:20 AM 02/28/2021    9:51 AM 01/17/2019    9:48 AM  Depression screen PHQ 2/9  Decreased Interest '2 2 2  '$ Down, Depressed, Hopeless '2 2 1  '$ PHQ - 2  Score '4 4 3  '$ Altered sleeping '2 3 3  '$ Tired, decreased energy '2 3 3  '$ Change in appetite '2 3 3  '$ Feeling bad or failure about yourself  '2 2 1  '$ Trouble concentrating '2 2 2  '$ Moving slowly or fidgety/restless 0 0 0  Suicidal thoughts 0 0 0  PHQ-9 Score '14 17 15  '$ Difficult doing work/chores Somewhat difficult Somewhat difficult Somewhat difficult    Assessment/Plan:  Dental abscess  -Discussed the importance of setting up follow-up with dentist - Plan: amoxicillin (AMOXIL) 500 MG tablet  Dental caries -Advised to self follow-up with dental provider  - Plan: amoxicillin (AMOXIL) 500 MG  tablet  Cigarette nicotine dependence without complication -Smoking cessation counseling greater than 3 minutes, less than 10 minutes -Discussed cutting down further/picking a quit date -Starting Wellbutrin for anxiety/depression, but will also help with smoking - Plan: buPROPion (WELLBUTRIN XL) 150 MG 24 hr tablet  GAD (generalized anxiety disorder) -Discussed counseling -Self-care -Given strict precautions - Plan: cyclobenzaprine (FLEXERIL) 10 MG tablet, buPROPion (WELLBUTRIN XL) 150 MG 24 hr tablet  Depression, major, single episode, moderate (HCC)  -PHQ-9 score 14 this visit -Discussed counseling -Discussed r/b/a of various medications.  Patient wishes to start medication. -Given strict precautions - Plan: buPROPion (WELLBUTRIN XL) 150 MG 24 hr tablet  Tension -Likely 2/2 increased stress/anxiety -Self-care encouraged including exercise  F/u 4-6 weeks, sooner if needed  Grier Mitts, MD

## 2023-01-09 NOTE — Progress Notes (Deleted)
Follow Up Note  RE: William Bowman Mention MRN: [id number] DOB: 08/10/1976 Date of Office Visit: 01/10/2023  Referring provider: Billie Ruddy, MD Primary care provider: Billie Ruddy, MD  Chief Complaint: No chief complaint on file.  History of Present Illness: I had the pleasure of seeing William Bowman for a follow up visit at the [facility] [place] [facility] [place] on 01/09/2023. He is a 47 y.o. male, who is being followed for asthma and allergic rhinoconjunctivitis. His previous allergy office visit was on 11/02/2021 with Dr. Maudie Mercury. Today is a regular follow up visit.  Asthma Past history - Episodes of shortness of breath, chest tightness, coughing and wheezing for the past 44 years.  Patient used to have an albuterol in the past but has not used one for 4 years.  Recently noted excessive shortness of breath with any kind of exertion and inability to exhale fully.  Apparently had recent normal EKG.  No previous cardiac evaluation.  History of pneumonias in the past and there was a questionable COPD diagnosis as well. Last spirometry showed mixed restriction and mild obstruction with no improvement in FEV1 post bronchodilator treatment.  Patient clinically felt unchanged with Xopenex treatment. Tried Symbicort with no benefit. Breo not covered. Interim history - breathing is better with Advair.  Today's spirometry showed some restriction. Daily controller medication(s): INCREASE Advair to 570mg 1 puff twice a day and rinse mouth after each use.  May use albuterol rescue inhaler 2 puffs every 4 to 6 hours as needed for shortness of breath, chest tightness, coughing, and wheezing. May use albuterol rescue inhaler 2 puffs 5 to 15 minutes prior to strenuous physical activities. Monitor frequency of use.  Get spirometry at next visit. Discussed that sometimes anxiety can cause shortness of breath sensation as well - recommend following up with PCP regarding anxiety.   Seasonal and perennial  allergic rhinoconjunctivitis Past history - Perennial rhinoconjunctivitis symptoms for the last 10+ years.  2020 skin testing showed: Positive to grass, weed, ragweed, trees, mold, dust mites, dog, cat, cockroach. 1 cat at home.  Interim history - symptoms controlled.  Continue environmental control measures as below. Use over the counter antihistamines such as Zyrtec (cetirizine), Claritin (loratadine), Allegra (fexofenadine), or Xyzal (levocetirizine) daily as needed. May take twice a day during allergy flares. May switch antihistamines every few months. Use Nasacort (triamcinolone) nasal spray 1 spray per nostril twice a day as needed for nasal congestion.  Use azelastine nasal spray 1-2 sprays per nostril twice a day as needed for runny nose/drainage. Use olopatadine eye drops 0.2% once a day as needed for itchy/watery eyes. Consider allergy injections for long term control if above medications do not help the symptoms - handout given.    Return in about 3 months (around 02/02/2022).  Assessment and Plan: LAdrianais a 47y.o. male with: No problem-specific Assessment & Plan notes found for this encounter.  No follow-ups on file.  No orders of the defined types were placed in this encounter.  Lab Orders  No laboratory test(s) ordered today    Diagnostics: Spirometry:  Tracings reviewed. His effort: {Blank single:19197::"Good reproducible efforts.","It was hard to get consistent efforts and there is a question as to whether this reflects a maximal maneuver.","Poor effort, data can not be interpreted."} FVC: ***L FEV1: ***L, ***% predicted FEV1/FVC ratio: ***% Interpretation: {Blank single:19197::"Spirometry consistent with mild obstructive disease","Spirometry consistent with moderate obstructive disease","Spirometry consistent with severe obstructive disease","Spirometry consistent with possible restrictive disease","Spirometry consistent with mixed obstructive and  restrictive  disease","Spirometry uninterpretable due to technique","Spirometry consistent with normal pattern","No overt abnormalities noted given today's efforts"}.  Please see scanned spirometry results for details.  Skin Testing: {Blank single:19197::"Select foods","Environmental allergy panel","Environmental allergy panel and select foods","Food allergy panel","None","Deferred due to recent antihistamines use"}. *** Results discussed with patient/family.   Medication List:  Current Outpatient Medications  Medication Sig Dispense Refill   albuterol (PROAIR HFA) 108 (90 Base) MCG/ACT inhaler Inhale 2 puffs into the lungs every 4 (four) hours as needed for wheezing or shortness of breath (coughing fits). 18 g 1   azelastine (ASTELIN) 0.1 % nasal spray Place 1-2 sprays into both nostrils 2 (two) times daily as needed (nasal drainage). Use in each nostril as directed 30 mL 5   buPROPion (WELLBUTRIN XL) 150 MG 24 hr tablet Take 1 tablet (150 mg total) by mouth daily. 30 tablet 4   cetirizine (ZYRTEC) 10 MG tablet Take 10 mg by mouth daily as needed for allergies.     cyclobenzaprine (FLEXERIL) 10 MG tablet Take 1 tablet (10 mg total) by mouth 3 (three) times daily as needed for muscle spasms. 30 tablet 0   fluticasone-salmeterol (ADVAIR DISKUS) 500-50 MCG/ACT AEPB Inhale 1 puff into the lungs in the morning and at bedtime. Rinse mouth after each use. (Patient not taking: Reported on 08/04/2022) 60 each 5   gabapentin (NEURONTIN) 100 MG capsule TAKE 2 CAPSULES BY MOUTH AT BEDTIME 60 capsule 0   Olopatadine HCl 0.2 % SOLN Apply 1 drop to eye daily as needed (itchy/watery eyes). (Patient not taking: Reported on 08/04/2022) 2.5 mL 5   sertraline (ZOLOFT) 25 MG tablet Take 1 tablet (25 mg total) by mouth daily. (Patient not taking: Reported on 11/02/2021) 30 tablet 2   terbinafine (LAMISIL) 250 MG tablet Take 1 tablet by mouth once daily (Patient not taking: Reported on 02/28/2021) 30 tablet 0   triamcinolone ointment  (KENALOG) 0.5 % Apply 1 application topically 2 (two) times daily. 30 g 7   Vitamin D, Ergocalciferol, (DRISDOL) 1.25 MG (50000 UNIT) CAPS capsule Take 1 capsule (50,000 Units total) by mouth every 7 (seven) days. (Patient not taking: Reported on 08/04/2022) 12 capsule 0   No current facility-administered medications for this visit.   Allergies: No Known Allergies I reviewed his past medical history, social history, family history, and environmental history and no significant changes have been reported from his previous visit.  Review of Systems  Constitutional:  Negative for appetite change, chills, fever and unexpected weight change.  HENT:  Negative for congestion, postnasal drip and rhinorrhea.   Eyes:  Negative for itching.  Respiratory:  Positive for shortness of breath. Negative for cough, chest tightness and wheezing.   Gastrointestinal:  Negative for abdominal pain.  Skin:  Negative for rash.  Allergic/Immunologic: Positive for environmental allergies.  Neurological:  Negative for headaches.    Objective: There were no vitals taken for this visit. There is no height or weight on file to calculate BMI. Physical Exam Vitals and nursing note reviewed.  Constitutional:      Appearance: Normal appearance. He is well-developed. He is obese.  HENT:     Head: Normocephalic and atraumatic.     Right Ear: Tympanic membrane and external ear normal.     Left Ear: Tympanic membrane and external ear normal.     Nose: Nose normal.     Mouth/Throat:     Mouth: Mucous membranes are moist.     Pharynx: Oropharynx is clear.  Eyes:  Conjunctiva/sclera: Conjunctivae normal.  Cardiovascular:     Rate and Rhythm: Normal rate and regular rhythm.     Heart sounds: Normal heart sounds. No murmur heard. Pulmonary:     Effort: Pulmonary effort is normal.     Breath sounds: Normal breath sounds. No wheezing, rhonchi or rales.  Musculoskeletal:     Cervical back: Neck supple.  Skin:     General: Skin is warm and dry.     Findings: No rash.  Neurological:     Mental Status: He is alert and oriented to person, place, and time.  Psychiatric:        Behavior: Behavior normal.    Previous notes and tests were reviewed. The plan was reviewed with the patient/family, and all questions/concerned were addressed.  It was my pleasure to see Roran today and participate in his care. Please feel free to contact me with any questions or concerns.  Sincerely,  Rexene Alberts, DO Allergy & Immunology  [facility] office: [place] office: [phone]

## 2023-01-10 ENCOUNTER — Ambulatory Visit: Payer: 59 | Admitting: Allergy

## 2023-01-10 DIAGNOSIS — J3089 Other allergic rhinitis: Secondary | ICD-10-CM

## 2023-01-29 ENCOUNTER — Ambulatory Visit: Payer: 59 | Admitting: Allergy & Immunology

## 2023-01-29 DIAGNOSIS — J309 Allergic rhinitis, unspecified: Secondary | ICD-10-CM

## 2023-02-04 NOTE — Progress Notes (Unsigned)
Follow Up Note  RE: William Bowman MRN: [id number] DOB: 08-Sep-1976 Date of Office Visit: 02/05/2023  Referring provider: Billie Ruddy, MD Primary care provider: Billie Ruddy, MD  Chief Complaint: No chief complaint on file.  History of Present Illness: I had the pleasure of seeing William Bowman for a follow up visit at the [facility] [place] [facility] [place] on 02/04/2023. He is a 47 y.o. male, who is being followed for asthma and allergic rhinoconjunctivitis. His previous allergy office visit was on 11/02/2021 with Dr. Maudie Mercury. Today is a regular follow up visit.  Asthma Past history - Episodes of shortness of breath, chest tightness, coughing and wheezing for the past 44 years.  Patient used to have an albuterol in the past but has not used one for 4 years.  Recently noted excessive shortness of breath with any kind of exertion and inability to exhale fully.  Apparently had recent normal EKG.  No previous cardiac evaluation.  History of pneumonias in the past and there was a questionable COPD diagnosis as well. Last spirometry showed mixed restriction and mild obstruction with no improvement in FEV1 post bronchodilator treatment.  Patient clinically felt unchanged with Xopenex treatment. Tried Symbicort with no benefit. Breo not covered. Interim history - breathing is better with Advair.  Today's spirometry showed some restriction. Daily controller medication(s): INCREASE Advair to 573mg 1 puff twice a day and rinse mouth after each use.  May use albuterol rescue inhaler 2 puffs every 4 to 6 hours as needed for shortness of breath, chest tightness, coughing, and wheezing. May use albuterol rescue inhaler 2 puffs 5 to 15 minutes prior to strenuous physical activities. Monitor frequency of use.  Get spirometry at next visit. Discussed that sometimes anxiety can cause shortness of breath sensation as well - recommend following up with PCP regarding anxiety.   Seasonal and perennial  allergic rhinoconjunctivitis Past history - Perennial rhinoconjunctivitis symptoms for the last 10+ years.  2020 skin testing showed: Positive to grass, weed, ragweed, trees, mold, dust mites, dog, cat, cockroach. 1 cat at home.  Interim history - symptoms controlled.  Continue environmental control measures as below. Use over the counter antihistamines such as Zyrtec (cetirizine), Claritin (loratadine), Allegra (fexofenadine), or Xyzal (levocetirizine) daily as needed. May take twice a day during allergy flares. May switch antihistamines every few months. Use Nasacort (triamcinolone) nasal spray 1 spray per nostril twice a day as needed for nasal congestion.  Use azelastine nasal spray 1-2 sprays per nostril twice a day as needed for runny nose/drainage. Use olopatadine eye drops 0.2% once a day as needed for itchy/watery eyes. Consider allergy injections for long term control if above medications do not help the symptoms - handout given.   Assessment and Plan: [name] a 47y.o. male with: No problem-specific Assessment & Plan notes found for this encounter.  No follow-ups on file.  No orders of the defined types were placed in this encounter.  Lab Orders  No laboratory test(s) ordered today    Diagnostics: Spirometry:  Tracings reviewed. His effort: {Blank single:19197::"Good reproducible efforts.","It was hard to get consistent efforts and there is a question as to whether this reflects a maximal maneuver.","Poor effort, data can not be interpreted."} FVC: ***L FEV1: ***L, ***% predicted FEV1/FVC ratio: ***% Interpretation: {Blank single:19197::"Spirometry consistent with mild obstructive disease","Spirometry consistent with moderate obstructive disease","Spirometry consistent with severe obstructive disease","Spirometry consistent with possible restrictive disease","Spirometry consistent with mixed obstructive and restrictive disease","Spirometry uninterpretable due to  technique","Spirometry consistent with  normal pattern","No overt abnormalities noted given today's efforts"}.  Please see scanned spirometry results for details.  Skin Testing: {Blank single:19197::"Select foods","Environmental allergy panel","Environmental allergy panel and select foods","Food allergy panel","None","Deferred due to recent antihistamines use"}. *** Results discussed with patient/family.   Medication List:  Current Outpatient Medications  Medication Sig Dispense Refill  . albuterol (PROAIR HFA) 108 (90 Base) MCG/ACT inhaler Inhale 2 puffs into the lungs every 4 (four) hours as needed for wheezing or shortness of breath (coughing fits). 18 g 1  . azelastine (ASTELIN) 0.1 % nasal spray Place 1-2 sprays into both nostrils 2 (two) times daily as needed (nasal drainage). Use in each nostril as directed 30 mL 5  . buPROPion (WELLBUTRIN XL) 150 MG 24 hr tablet Take 1 tablet (150 mg total) by mouth daily. 30 tablet 4  . cetirizine (ZYRTEC) 10 MG tablet Take 10 mg by mouth daily as needed for allergies.    . cyclobenzaprine (FLEXERIL) 10 MG tablet Take 1 tablet (10 mg total) by mouth 3 (three) times daily as needed for muscle spasms. 30 tablet 0  . fluticasone-salmeterol (ADVAIR DISKUS) 500-50 MCG/ACT AEPB Inhale 1 puff into the lungs in the morning and at bedtime. Rinse mouth after each use. (Patient not taking: Reported on 08/04/2022) 60 each 5  . gabapentin (NEURONTIN) 100 MG capsule TAKE 2 CAPSULES BY MOUTH AT BEDTIME 60 capsule 0  . Olopatadine HCl 0.2 % SOLN Apply 1 drop to eye daily as needed (itchy/watery eyes). (Patient not taking: Reported on 08/04/2022) 2.5 mL 5  . sertraline (ZOLOFT) 25 MG tablet Take 1 tablet (25 mg total) by mouth daily. (Patient not taking: Reported on 11/02/2021) 30 tablet 2  . terbinafine (LAMISIL) 250 MG tablet Take 1 tablet by mouth once daily (Patient not taking: Reported on 02/28/2021) 30 tablet 0  . triamcinolone ointment (KENALOG) 0.5 % Apply 1  application topically 2 (two) times daily. 30 g 7  . Vitamin D, Ergocalciferol, (DRISDOL) 1.25 MG (50000 UNIT) CAPS capsule Take 1 capsule (50,000 Units total) by mouth every 7 (seven) days. (Patient not taking: Reported on 08/04/2022) 12 capsule 0   No current facility-administered medications for this visit.   Allergies: No Known Allergies I reviewed his past medical history, social history, family history, and environmental history and no significant changes have been reported from his previous visit.  Review of Systems  Constitutional:  Negative for appetite change, chills, fever and unexpected weight change.  HENT:  Negative for congestion, postnasal drip and rhinorrhea.   Eyes:  Negative for itching.  Respiratory:  Positive for shortness of breath. Negative for cough, chest tightness and wheezing.   Gastrointestinal:  Negative for abdominal pain.  Skin:  Negative for rash.  Allergic/Immunologic: Positive for environmental allergies.  Neurological:  Negative for headaches.   Objective: There were no vitals taken for this visit. There is no height or weight on file to calculate BMI. Physical Exam Vitals and nursing note reviewed.  Constitutional:      Appearance: Normal appearance. He is well-developed. He is obese.  HENT:     Head: Normocephalic and atraumatic.     Right Ear: Tympanic membrane and external ear normal.     Left Ear: Tympanic membrane and external ear normal.     Nose: Nose normal.     Mouth/Throat:     Mouth: Mucous membranes are moist.     Pharynx: Oropharynx is clear.  Eyes:     Conjunctiva/sclera: Conjunctivae normal.  Cardiovascular:  Rate and Rhythm: Normal rate and regular rhythm.     Heart sounds: Normal heart sounds. No murmur heard. Pulmonary:     Effort: Pulmonary effort is normal.     Breath sounds: Normal breath sounds. No wheezing, rhonchi or rales.  Musculoskeletal:     Cervical back: Neck supple.  Skin:    General: Skin is warm and  dry.     Findings: No rash.  Neurological:     Mental Status: He is alert and oriented to person, place, and time.  Psychiatric:        Behavior: Behavior normal.  Previous notes and tests were reviewed. The plan was reviewed with the patient/family, and all questions/concerned were addressed.  It was my pleasure to see Rye today and participate in his care. Please feel free to contact me with any questions or concerns.  Sincerely,  Rexene Alberts, DO Allergy & Immunology  [facility] office: [place] office: [phone]

## 2023-02-05 ENCOUNTER — Other Ambulatory Visit: Payer: Self-pay

## 2023-02-05 ENCOUNTER — Encounter: Payer: Self-pay | Admitting: Allergy

## 2023-02-05 ENCOUNTER — Ambulatory Visit: Payer: 59 | Admitting: Allergy

## 2023-02-05 VITALS — BP 146/82 | HR 100 | Temp 98.2°F | Resp 18 | Ht 67.0 in | Wt 252.2 lb

## 2023-02-05 DIAGNOSIS — J302 Other seasonal allergic rhinitis: Secondary | ICD-10-CM

## 2023-02-05 DIAGNOSIS — R030 Elevated blood-pressure reading, without diagnosis of hypertension: Secondary | ICD-10-CM

## 2023-02-05 DIAGNOSIS — R0602 Shortness of breath: Secondary | ICD-10-CM

## 2023-02-05 DIAGNOSIS — H1013 Acute atopic conjunctivitis, bilateral: Secondary | ICD-10-CM

## 2023-02-05 DIAGNOSIS — H1010 Acute atopic conjunctivitis, unspecified eye: Secondary | ICD-10-CM

## 2023-02-05 MED ORDER — BUDESONIDE-FORMOTEROL FUMARATE 80-4.5 MCG/ACT IN AERO: 2.0000 | INHALATION_SPRAY | Freq: Two times a day (BID) | RESPIRATORY_TRACT | 3 refills | Status: DC

## 2023-02-05 NOTE — Patient Instructions (Addendum)
Breathing:  Get chest X-ray. Daily controller medication(s): start Symbicort 40mg 2 puffs twice a day with spacer and rinse mouth afterwards. Breathing control goals:  Full participation in all desired activities (may need albuterol before activity) Albuterol use two times or less a week on average (not counting use with activity) Cough interfering with sleep two times or less a month Oral steroids no more than once a year No hospitalizations   Environmental allergies 2020 skin testing showed: Positive to grass, weed, ragweed, trees, mold, dust mites, dog, cat, cockroach. Continue environmental control measures as below. Use over the counter antihistamines such as Zyrtec (cetirizine), Claritin (loratadine), Allegra (fexofenadine), or Xyzal (levocetirizine) daily as needed. May take twice a day during allergy flares. May switch antihistamines every few months. Use Nasacort (triamcinolone) nasal spray 1 spray per nostril twice a day as needed for nasal congestion.  Use azelastine nasal spray 1-2 sprays per nostril twice a day as needed for runny nose/drainage. Use olopatadine eye drops 0.2% once a day as needed for itchy/watery eyes. Consider allergy injections for long term control if above medications do not help the symptoms.  Follow up in 2 months or sooner if needed.   Follow up with PCP regarding these symptoms and your blood pressure.

## 2023-02-05 NOTE — Assessment & Plan Note (Signed)
Past history - Perennial rhinoconjunctivitis symptoms for the last 10+ years.  2020 skin testing showed: Positive to grass, weed, ragweed, trees, mold, dust mites, dog, cat, cockroach. 1 cat at home.  Interim history - symptoms controlled with antihistamines only. Continue environmental control measures as below. Use over the counter antihistamines such as Zyrtec (cetirizine), Claritin (loratadine), Allegra (fexofenadine), or Xyzal (levocetirizine) daily as needed. May take twice a day during allergy flares. May switch antihistamines every few months. Use Nasacort (triamcinolone) nasal spray 1 spray per nostril twice a day as needed for nasal congestion.  Use azelastine nasal spray 1-2 sprays per nostril twice a day as needed for runny nose/drainage. Use olopatadine eye drops 0.2% once a day as needed for itchy/watery eyes. Consider allergy injections for long term control if above medications do not help the symptoms.

## 2023-02-05 NOTE — Assessment & Plan Note (Signed)
Past history - Episodes of shortness of breath, chest tightness, coughing and wheezing for the past 44 years.  Patient used to have an albuterol in the past but has not used one for 4 years.  Recently noted excessive shortness of breath with any kind of exertion and inability to exhale fully.  Apparently had recent normal EKG.  No previous cardiac evaluation.  History of pneumonias in the past and there was a questionable COPD diagnosis as well. Last spirometry showed mixed restriction and mild obstruction with no improvement in FEV1 post bronchodilator treatment.  Patient clinically felt unchanged with Xopenex treatment. Tried Symbicort with no benefit. Breo not covered. Interim history - no inhalers for 6 months and noted the shortness of breath issue. He is unsure if ICS/LABA inhalers made a difference. Increased stress. Denies reflux/cardiac issues.  Today's spirometry showed severe restriction with no improvement in FEV1 post bronchodilator treatment. Clinically feeling unchanged.  Get chest X-ray due to complaints of right sided symptoms.  Discussed that his clinical history does not support reactive airway disease/asthma. Nevertheless will do 2 month trial of inhaler. Recommend following up with PCP as well.  Daily controller medication(s): start Symbicort 80mg 2 puffs twice a day with spacer and rinse mouth afterwards. Get spirometry at next visit.

## 2023-04-06 ENCOUNTER — Ambulatory Visit: Payer: 59 | Admitting: Allergy

## 2024-05-26 ENCOUNTER — Ambulatory Visit: Admitting: Family Medicine

## 2024-06-09 ENCOUNTER — Ambulatory Visit: Admitting: Family Medicine

## 2024-06-09 ENCOUNTER — Encounter: Payer: Self-pay | Admitting: Family Medicine

## 2024-06-09 VITALS — BP 138/84 | HR 64 | Temp 98.3°F | Ht 67.0 in | Wt 243.2 lb

## 2024-06-09 DIAGNOSIS — R140 Abdominal distension (gaseous): Secondary | ICD-10-CM | POA: Diagnosis not present

## 2024-06-09 DIAGNOSIS — R198 Other specified symptoms and signs involving the digestive system and abdomen: Secondary | ICD-10-CM

## 2024-06-09 DIAGNOSIS — L309 Dermatitis, unspecified: Secondary | ICD-10-CM

## 2024-06-09 DIAGNOSIS — I10 Essential (primary) hypertension: Secondary | ICD-10-CM | POA: Diagnosis not present

## 2024-06-09 MED ORDER — TRIAMCINOLONE ACETONIDE 0.5 % EX OINT: 1.0000 | TOPICAL_OINTMENT | Freq: Two times a day (BID) | CUTANEOUS | 2 refills | Status: AC

## 2024-06-09 MED ORDER — AMLODIPINE BESYLATE 2.5 MG PO TABS: 2.5000 mg | ORAL_TABLET | Freq: Every day | ORAL | 1 refills | Status: DC

## 2024-06-09 MED ORDER — AMLODIPINE BESYLATE 2.5 MG PO TABS: 2.5000 mg | ORAL_TABLET | Freq: Every day | ORAL | 1 refills | Status: AC

## 2024-06-09 MED ORDER — TRIAMCINOLONE ACETONIDE 0.5 % EX OINT: 1.0000 | TOPICAL_OINTMENT | Freq: Two times a day (BID) | CUTANEOUS | 2 refills | Status: DC

## 2024-06-09 NOTE — Progress Notes (Unsigned)
 Established Patient Office Visit   Subjective  Patient ID: William Bowman, male    DOB: 1976/06/26  Age: 48 y.o. MRN: [id number]  Chief Complaint  Patient presents with  . Medical Management of Chronic Issues    Patient came in today for a rash on the chest and stomach, that started over a year ago, itchy,and weight loss, patient would like a referral to GI for feeling full all the time    HPI  Patient Active Problem List   Diagnosis Date Noted  . Elevated blood pressure reading 02/05/2023  . Snoring 04/13/2021  . Right upper quadrant abdominal pain 10/14/2019  . Seasonal and perennial allergic rhinoconjunctivitis 06/16/2019  . Shortness of breath 04/16/2019  . Anxiety 01/21/2019  . Bursitis of right shoulder 06/05/2018  . Polyarthralgia 04/15/2018  . 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 Low back pain with right-sided sciatica 04/15/2018   Past Medical History:  Diagnosis Date  . Allergy    . Anxiety   . Asthma   . Depression   . Eczema   . Pneumonia    Past Surgical History:  Procedure Laterality Date  . NO PAST SURGERIES     Social History   Tobacco Use  . Smoking status: Former    Types: Cigarettes  . Smokeless tobacco: Never  Vaping Use  . Vaping status: Never Used  Substance Use Topics  . Alcohol use: Yes    Comment: ocassionally beer and/or liquor  . Drug use: Yes    Frequency: 2.0 times per week    Types: Marijuana    Comment: last smoked 12/02/2019   Family History  Adopted: Yes  Problem Relation Age of Onset  . Diabetes Mother   . Pancreatic cancer Maternal Aunt   . Heart disease Maternal Aunt   . Diabetes Maternal Uncle   . Colon cancer Neg Hx   . Stomach cancer Neg Hx   . Rectal cancer Neg Hx    No Known Allergies  ROS Negative unless stated above    Objective:     BP (!) 142/84 (BP Location: Left Arm, Patient Position: Sitting, Cuff Size: Normal)   Pulse 64   Temp 98.3 F (36.8 C) (Oral)   Ht 5' 7 (1.702 m)   Wt  243 lb 3.2 oz (110.3 kg)   SpO2 98%   BMI 38.09 kg/m  BP Readings from Last 3 Encounters:  06/09/24 (!) 142/84  02/05/23 (!) 146/82  08/04/22 (!) 132/90   Wt Readings from Last 3 Encounters:  06/09/24 243 lb 3.2 oz (110.3 kg)  02/05/23 252 lb 3.2 oz (114.4 kg)  08/04/22 258 lb (117 kg)      Physical Exam     08/04/2022   11:20 AM 02/28/2021    9:51 AM 01/17/2019    9:48 AM  Depression screen PHQ 2/9  Decreased Interest 2 2 2   Down, Depressed, Hopeless 2 2 1   PHQ - 2 Score 4 4 3   Altered sleeping 2 3 3   Tired, decreased energy 2 3 3   Change in appetite 2 3 3   Feeling bad or failure about yourself  2 2 1   Trouble concentrating 2 2 2   Moving slowly or fidgety/restless 0 0 0  Suicidal thoughts 0 0 0  PHQ-9 Score 14 17 15   Difficult doing work/chores Somewhat difficult Somewhat difficult Somewhat difficult      02/28/2021    9:52 AM 01/17/2019    9:48 AM  GAD 7 : Generalized Anxiety Score  Nervous,  Anxious, on Edge 3 2  Control/stop worrying 3 3  Worry too much - different things 3 3  Trouble relaxing 3 3  Restless 0 1  Easily annoyed or irritable 3 2  Afraid - awful might happen 3 2  Total GAD 7 Score 18 16  Anxiety Difficulty Somewhat difficult Somewhat difficult     No results found for any visits on 06/09/24.    Assessment & Plan:   Bloating -     Ambulatory referral to Gastroenterology  Fullness of abdomen -     Ambulatory referral to Gastroenterology  Essential hypertension -     amLODIPine Besylate; Take 1 tablet (2.5 mg total) by mouth daily.  Dispense: 90 tablet; Refill: 1  Eczema, unspecified type -     Triamcinolone  Acetonide; Apply 1 Application topically 2 (two) times daily.  Dispense: 60 g; Refill: 2    Return in about 4 weeks (around 07/07/2024), or if symptoms worsen or fail to improve, for physical.   Viola Greulich, MD

## 2024-07-07 ENCOUNTER — Ambulatory Visit: Admitting: Family Medicine

## 2024-07-07 ENCOUNTER — Encounter: Payer: Self-pay | Admitting: Family Medicine

## 2024-07-07 VITALS — BP 138/78 | HR 57 | Temp 98.4°F | Ht 67.0 in | Wt 244.8 lb

## 2024-07-07 DIAGNOSIS — Z131 Encounter for screening for diabetes mellitus: Secondary | ICD-10-CM | POA: Diagnosis not present

## 2024-07-07 DIAGNOSIS — Z1159 Encounter for screening for other viral diseases: Secondary | ICD-10-CM

## 2024-07-07 DIAGNOSIS — M791 Myalgia, unspecified site: Secondary | ICD-10-CM

## 2024-07-07 DIAGNOSIS — I10 Essential (primary) hypertension: Secondary | ICD-10-CM

## 2024-07-07 DIAGNOSIS — F411 Generalized anxiety disorder: Secondary | ICD-10-CM

## 2024-07-07 DIAGNOSIS — Z6838 Body mass index (BMI) 38.0-38.9, adult: Secondary | ICD-10-CM

## 2024-07-07 DIAGNOSIS — F321 Major depressive disorder, single episode, moderate: Secondary | ICD-10-CM

## 2024-07-07 DIAGNOSIS — Z13220 Encounter for screening for lipoid disorders: Secondary | ICD-10-CM | POA: Diagnosis not present

## 2024-07-07 DIAGNOSIS — M7631 Iliotibial band syndrome, right leg: Secondary | ICD-10-CM

## 2024-07-07 DIAGNOSIS — E66812 Obesity, class 2: Secondary | ICD-10-CM | POA: Diagnosis not present

## 2024-07-07 DIAGNOSIS — Z125 Encounter for screening for malignant neoplasm of prostate: Secondary | ICD-10-CM

## 2024-07-07 DIAGNOSIS — Z0000 Encounter for general adult medical examination without abnormal findings: Secondary | ICD-10-CM | POA: Diagnosis not present

## 2024-07-07 LAB — LIPID PANEL
Cholesterol: 200 mg/dL (ref 0–200)
HDL: 53.6 mg/dL (ref >39.00)
LDL Cholesterol: 121 mg/dL — ABNORMAL HIGH (ref 0–99)
NonHDL: 146.33
Total CHOL/HDL Ratio: 4
Triglycerides: 127 mg/dL (ref 0.0–149.0)
VLDL: 25.4 mg/dL (ref 0.0–40.0)

## 2024-07-07 LAB — COMPREHENSIVE METABOLIC PANEL WITH GFR
ALT: 20 U/L (ref 0–53)
AST: 16 U/L (ref 0–37)
Albumin: 4.5 g/dL (ref 3.5–5.2)
Alkaline Phosphatase: 45 U/L (ref 39–117)
BUN: 10 mg/dL (ref 6–23)
CO2: 27 meq/L (ref 19–32)
Calcium: 9.5 mg/dL (ref 8.4–10.5)
Chloride: 100 meq/L (ref 96–112)
Creatinine, Ser: 0.82 mg/dL (ref 0.40–1.50)
GFR: 103.17 mL/min (ref >60.00)
Glucose, Bld: 85 mg/dL (ref 70–99)
Potassium: 4.1 meq/L (ref 3.5–5.1)
Sodium: 136 meq/L (ref 135–145)
Total Bilirubin: 0.4 mg/dL (ref 0.2–1.2)
Total Protein: 7.1 g/dL (ref 6.0–8.3)

## 2024-07-07 LAB — CBC WITH DIFFERENTIAL/PLATELET
Basophils Absolute: 0 K/uL (ref 0.0–0.1)
Basophils Relative: 0.4 % (ref 0.0–3.0)
Eosinophils Absolute: 0.2 K/uL (ref 0.0–0.7)
Eosinophils Relative: 2.6 % (ref 0.0–5.0)
HCT: 41.8 % (ref 39.0–52.0)
Hemoglobin: 14.2 g/dL (ref 13.0–17.0)
Lymphocytes Relative: 37.7 % (ref 12.0–46.0)
Lymphs Abs: 2.4 K/uL (ref 0.7–4.0)
MCHC: 34 g/dL (ref 30.0–36.0)
MCV: 94.5 fl (ref 78.0–100.0)
Monocytes Absolute: 0.4 K/uL (ref 0.1–1.0)
Monocytes Relative: 6.5 % (ref 3.0–12.0)
Neutro Abs: 3.4 K/uL (ref 1.4–7.7)
Neutrophils Relative %: 52.8 % (ref 43.0–77.0)
Platelets: 223 K/uL (ref 150.0–400.0)
RBC: 4.43 Mil/uL (ref 4.22–5.81)
RDW: 13.3 % (ref 11.5–15.5)
WBC: 6.4 K/uL (ref 4.0–10.5)

## 2024-07-07 LAB — HEMOGLOBIN A1C: Hgb A1c MFr Bld: 6.1 % (ref 4.6–6.5)

## 2024-07-07 LAB — PSA: PSA: 0.37 ng/mL (ref 0.10–4.00)

## 2024-07-07 LAB — VITAMIN D 25 HYDROXY (VIT D DEFICIENCY, FRACTURES): VITD: 14.12 ng/mL — ABNORMAL LOW (ref 30.00–100.00)

## 2024-07-07 LAB — TSH: TSH: 1.35 u[IU]/mL (ref 0.35–5.50)

## 2024-07-07 LAB — VITAMIN B12: Vitamin B-12: 882 pg/mL (ref 211–911)

## 2024-07-07 LAB — T4, FREE: Free T4: 0.81 ng/dL (ref 0.60–1.60)

## 2024-07-07 NOTE — Progress Notes (Signed)
 Established Patient Office Visit   Subjective  Patient ID: William Bowman, male    DOB: 16-Sep-1976  Age: 48 y.o. MRN: [id number]  Chief Complaint  Patient presents with   Annual Exam    Congestion and cough, and a sport Med referral patient is having pain,     Patient is a 48 year old male seen for CPE and ongoing concern.  Pt has not been checking bp since starting norvasc  2.5 mg last month.  Has not had any of the HAs of neck pain since starting med.  Pt requests referral to sports medicine.  Endorses chronic tension chronic pain in the back, LEs, and groin.  Recently realized he has been carrying increased tension and has difficulty relaxing.  Denies feeling increased anxiety.  Patient also mentions popping back/right shoulder blade in 2007 then having pain intermittently ever since.  Also finds it difficult to take a deep breath in unless posture is shifted.    Patient Active Problem List   Diagnosis Date Noted   Elevated blood pressure reading 02/05/2023   Snoring 04/13/2021   Right upper quadrant abdominal pain 10/14/2019   Seasonal and perennial allergic rhinoconjunctivitis 06/16/2019   Shortness of breath 04/16/2019   Anxiety 01/21/2019   Bursitis of right shoulder 06/05/2018   Polyarthralgia 04/15/2018   AC (acromioclavicular) arthritis 04/15/2018   Low back pain with right-sided sciatica 04/15/2018   Past Medical History:  Diagnosis Date   Allergy     Anxiety    Asthma    Depression    Eczema    Pneumonia    Past Surgical History:  Procedure Laterality Date   NO PAST SURGERIES     Social History   Tobacco Use   Smoking status: Former    Types: Cigarettes   Smokeless tobacco: Never  Vaping Use   Vaping status: Never Used  Substance Use Topics   Alcohol use: Yes    Comment: ocassionally beer and/or liquor   Drug use: Yes    Frequency: 2.0 times per week    Types: Marijuana    Comment: last smoked 12/02/2019   Family History  Adopted: Yes  Problem  Relation Age of Onset   Diabetes Mother    Pancreatic cancer Maternal Aunt    Heart disease Maternal Aunt    Diabetes Maternal Uncle    Colon cancer Neg Hx    Stomach cancer Neg Hx    Rectal cancer Neg Hx    No Known Allergies  ROS Negative unless stated above    Objective:     BP 138/78 (BP Location: Left Arm, Patient Position: Sitting, Cuff Size: Normal)   Pulse (!) 57   Temp 98.4 F (36.9 C) (Oral)   Ht 5' 7 (1.702 m)   Wt 244 lb 12.8 oz (111 kg)   SpO2 94%   BMI 38.34 kg/m  BP Readings from Last 3 Encounters:  07/07/24 138/78  06/09/24 138/84  02/05/23 (!) 146/82   Wt Readings from Last 3 Encounters:  07/07/24 244 lb 12.8 oz (111 kg)  06/09/24 243 lb 3.2 oz (110.3 kg)  02/05/23 252 lb 3.2 oz (114.4 kg)      Physical Exam Constitutional:      Appearance: Normal appearance. He is obese.  HENT:     Head: Normocephalic and atraumatic.     Right Ear: Tympanic membrane, ear canal and external ear normal.     Left Ear: Tympanic membrane, ear canal and external ear normal.     Nose: Nose  normal.     Mouth/Throat:     Mouth: Mucous membranes are moist.     Pharynx: No oropharyngeal exudate or posterior oropharyngeal erythema.  Eyes:     General: No scleral icterus.    Extraocular Movements: Extraocular movements intact.     Conjunctiva/sclera: Conjunctivae normal.     Pupils: Pupils are equal, round, and reactive to light.  Neck:     Thyroid : No thyromegaly.  Cardiovascular:     Rate and Rhythm: Normal rate and regular rhythm.     Pulses: Normal pulses.     Heart sounds: Normal heart sounds. No murmur heard.    No friction rub.  Pulmonary:     Effort: Pulmonary effort is normal.     Breath sounds: Normal breath sounds. No wheezing, rhonchi or rales.  Abdominal:     General: Bowel sounds are normal.     Palpations: Abdomen is soft.     Tenderness: There is no abdominal tenderness.     Comments: Central obesity.  Musculoskeletal:        General: No  deformity. Normal range of motion.     Cervical back: Normal.     Thoracic back: Normal.     Lumbar back: Normal.       Legs:     Comments: No TTP of cervical, thoracic, lumbar spine midline or paraspinal muscles.  Pt arches back/leans to left side with palpation of lumbar/sacral spine.  TTP of R lateral distal thigh.  Negative logroll, straight leg raise, FADIR, FABER bilaterally.  Lymphadenopathy:     Cervical: No cervical adenopathy.  Skin:    General: Skin is warm and dry.     Findings: No lesion.  Neurological:     General: No focal deficit present.     Mental Status: He is alert and oriented to person, place, and time.  Psychiatric:        Mood and Affect: Mood normal.        Thought Content: Thought content normal.        07/07/2024   11:54 AM 06/09/2024    9:33 AM 08/04/2022   11:20 AM  Depression screen PHQ 2/9  Decreased Interest 0 0 2  Down, Depressed, Hopeless 0 1 2  PHQ - 2 Score 0 1 4  Altered sleeping 0 0 2  Tired, decreased energy 1 1 2   Change in appetite 1 2 2   Feeling bad or failure about yourself  0 0 2  Trouble concentrating 0 0 2  Moving slowly or fidgety/restless 0 0 0  Suicidal thoughts 0 0 0  PHQ-9 Score 2 4 14   Difficult doing work/chores Not difficult at all Not difficult at all Somewhat difficult      07/07/2024   11:54 AM 06/09/2024    9:33 AM 02/28/2021    9:52 AM 01/17/2019    9:48 AM  GAD 7 : Generalized Anxiety Score  Nervous, Anxious, on Edge 1 3 3 2   Control/stop worrying 0 0 3 3  Worry too much - different things 0 1 3 3   Trouble relaxing 1 1 3 3   Restless 0 0 0 1  Easily annoyed or irritable 1 0 3 2  Afraid - awful might happen 0 0 3 2  Total GAD 7 Score 3 5 18 16   Anxiety Difficulty Not difficult at all Not difficult at all Somewhat difficult Somewhat difficult     No results found for any visits on 07/07/24.    Assessment & Plan:  Well adult exam -     CBC with Differential/Platelet; Future -     Comprehensive metabolic  panel with GFR; Future -     Hemoglobin A1c; Future -     Lipid panel; Future -     TSH; Future -     T4, free; Future  Essential hypertension -     Comprehensive metabolic panel with GFR; Future -     T4, free; Future  GAD (generalized anxiety disorder) -     TSH; Future -     T4, free; Future  Depression, major, single episode, moderate (HCC) -     TSH; Future -     T4, free; Future  Need for hepatitis C screening test -     Hepatitis C antibody  Muscle tension pain  It band syndrome, right -     Ambulatory referral to Sports Medicine  Class 2 severe obesity with serious comorbidity and body mass index (BMI) of 38.0 to 38.9 in adult, unspecified obesity type (HCC) -     Vitamin B12; Future -     VITAMIN D  25 Hydroxy (Vit-D Deficiency, Fractures); Future  Screening for prostate cancer -     PSA; Future  Age appropriate health screenings discussed. Obtain labs.  Immunizations reviewed.  Colonoscopy done 12/04/2019.  Repeat due in 7 yrs, 2027.  PHQ-9 score 2, GAD-7 score 3 this visit.  Anxiety and depression stable.  Continue self-care, deep breathing.  Consider counseling.  Advised anxiety likely manifesting as increased muscle tension.  Referral placed to sports med for IT band syndrome.  Weight loss encouraged as likely also contributing to increased muscle tension.  BP well-controlled.  Continue Norvasc  2.5 mg daily.  Return in about 3 months (around 10/07/2024). F/u in 3-4 months, sooner if needed.  Clotilda JONELLE Single, MD

## 2024-07-08 LAB — HEPATITIS C ANTIBODY: Hepatitis C Ab: NONREACTIVE

## 2024-07-17 ENCOUNTER — Ambulatory Visit: Payer: Self-pay | Admitting: Family Medicine

## 2024-07-17 DIAGNOSIS — E559 Vitamin D deficiency, unspecified: Secondary | ICD-10-CM

## 2024-07-17 MED ORDER — VITAMIN D (ERGOCALCIFEROL) 1.25 MG (50000 UNIT) PO CAPS: 50000.0000 [IU] | ORAL_CAPSULE | ORAL | 0 refills | Status: AC

## 2024-07-21 NOTE — Progress Notes (Deleted)
    [facility] [address] [place] [address] Phone: [phone]   Assessment and Plan:     There are no diagnoses linked to this encounter.  ***   Pertinent previous records reviewed include ***    Follow Up: ***     Subjective:   I, William Bowman, am serving as a Neurosurgeon for Doctor William Bowman  Chief Complaint: right  It band pain   HPI:   07/22/2024 Patient is a 48 year old male with right IT band pain. Patient states   Relevant Historical Information: ***  Additional pertinent review of systems negative.   Current Outpatient Medications:    amLODipine  (NORVASC ) 2.5 MG tablet, Take 1 tablet (2.5 mg total) by mouth daily., Disp: 90 tablet, Rfl: 1   cetirizine (ZYRTEC) 10 MG tablet, Take 10 mg by mouth daily as needed for allergies., Disp: , Rfl:    Cyanocobalamin  (VITAMIN B-12) 1000 MCG/15ML LIQD, Take by mouth., Disp: , Rfl:    triamcinolone  ointment (KENALOG ) 0.5 %, Apply 1 Application topically 2 (two) times daily., Disp: 60 g, Rfl: 2   Vitamin D , Ergocalciferol , (DRISDOL ) 1.25 MG (50000 UNIT) CAPS capsule, Take 1 capsule (50,000 Units total) by mouth every 7 (seven) days., Disp: 12 capsule, Rfl: 0   Objective:     There were no vitals filed for this visit.    There is no height or weight on file to calculate BMI.    Physical Exam:    ***   Electronically signed by:  [facility] 12:51 PM 07/21/24

## 2024-07-22 ENCOUNTER — Ambulatory Visit: Admitting: Sports Medicine

## 2024-07-30 NOTE — Progress Notes (Unsigned)
    William Bowman D.[facility] [address] [place] [address] Phone: [phone]   Assessment and Plan:     There are no diagnoses linked to this encounter.  ***   Pertinent previous records reviewed include ***    Follow Up: ***     Subjective:   I, William Bowman, am serving as a Neurosurgeon for Doctor Morene Mace  Chief Complaint: right  It band pain   HPI:   07/31/2024 Patient is a 48 year old male with right IT band pain. Patient states   Relevant Historical Information: ***  Additional pertinent review of systems negative.   Current Outpatient Medications:    amLODipine  (NORVASC ) 2.5 MG tablet, Take 1 tablet (2.5 mg total) by mouth daily., Disp: 90 tablet, Rfl: 1   cetirizine (ZYRTEC) 10 MG tablet, Take 10 mg by mouth daily as needed for allergies., Disp: , Rfl:    Cyanocobalamin  (VITAMIN B-12) 1000 MCG/15ML LIQD, Take by mouth., Disp: , Rfl:    triamcinolone  ointment (KENALOG ) 0.5 %, Apply 1 Application topically 2 (two) times daily., Disp: 60 g, Rfl: 2   Vitamin D , Ergocalciferol , (DRISDOL ) 1.25 MG (50000 UNIT) CAPS capsule, Take 1 capsule (50,000 Units total) by mouth every 7 (seven) days., Disp: 12 capsule, Rfl: 0   Objective:     There were no vitals filed for this visit.    There is no height or weight on file to calculate BMI.    Physical Exam:    ***   Electronically signed by:  Odis Mace D.[facility] 7:39 AM 07/30/24

## 2024-07-31 ENCOUNTER — Ambulatory Visit: Admitting: Sports Medicine

## 2024-07-31 VITALS — BP 130/70 | HR 107 | Ht 67.0 in | Wt 242.8 lb

## 2024-07-31 DIAGNOSIS — M898X1 Other specified disorders of bone, shoulder: Secondary | ICD-10-CM

## 2024-07-31 DIAGNOSIS — G8929 Other chronic pain: Secondary | ICD-10-CM | POA: Diagnosis not present

## 2024-07-31 DIAGNOSIS — M5441 Lumbago with sciatica, right side: Secondary | ICD-10-CM | POA: Diagnosis not present

## 2024-07-31 DIAGNOSIS — M5442 Lumbago with sciatica, left side: Secondary | ICD-10-CM | POA: Diagnosis not present

## 2024-07-31 DIAGNOSIS — M2550 Pain in unspecified joint: Secondary | ICD-10-CM

## 2024-07-31 LAB — URIC ACID: Uric Acid, Serum: 5.6 mg/dL (ref 4.0–7.8)

## 2024-07-31 LAB — FERRITIN: Ferritin: 141.5 ng/mL (ref 22.0–322.0)

## 2024-07-31 LAB — SEDIMENTATION RATE: Sed Rate: 11 mm/h (ref 0–15)

## 2024-07-31 LAB — C-REACTIVE PROTEIN: CRP: <1 mg/dL (ref 0.5–20.0)

## 2024-07-31 MED ORDER — MELOXICAM 15 MG PO TABS: 15.0000 mg | ORAL_TABLET | Freq: Every day | ORAL | 0 refills | Status: DC

## 2024-07-31 NOTE — Patient Instructions (Addendum)
-   Start meloxicam  15 mg daily x2 weeks.  If still having pain after 2 weeks, complete 3rd-week of NSAID. May use remaining NSAID as needed once daily for pain control.  Do not to use additional over-the-counter NSAIDs (ibuprofen , naproxen , Advil , Aleve , etc.) while taking prescription NSAIDs.  May use Tylenol  [id number] mg 2 to 3 times a day for breakthrough pain.  Hep for low back and core. PT referral to [facility]. Mri of lumbar spine at [facility]. Labs on the way out. Follow up 5 days after Mri.

## 2024-08-01 ENCOUNTER — Ambulatory Visit: Payer: Self-pay | Admitting: Sports Medicine

## 2024-08-01 LAB — ANA+ENA+DNA/DS+SCL 70+SJOSSA/B
ANA Titer 1: NEGATIVE
ENA RNP Ab: 0.3 AI (ref 0.0–0.9)
ENA SM Ab Ser-aCnc: <0.2 AI (ref 0.0–0.9)
ENA SSA (RO) Ab: <0.2 AI (ref 0.0–0.9)
ENA SSB (LA) Ab: <0.2 AI (ref 0.0–0.9)
Scleroderma (Scl-70) (ENA) Antibody, IgG: <0.2 AI (ref 0.0–0.9)
dsDNA Ab: <1 [IU]/mL (ref 0–9)

## 2024-08-11 ENCOUNTER — Ambulatory Visit
Admission: RE | Admit: 2024-08-11 | Discharge: 2024-08-11 | Disposition: A | Discharge status: Home / Self Care | Source: Ambulatory Visit | Attending: Sports Medicine

## 2024-08-11 DIAGNOSIS — G8929 Other chronic pain: Secondary | ICD-10-CM

## 2024-08-13 ENCOUNTER — Encounter: Payer: Self-pay | Admitting: Family Medicine

## 2024-08-14 ENCOUNTER — Encounter: Payer: Self-pay | Admitting: Physical Therapy

## 2024-08-14 ENCOUNTER — Other Ambulatory Visit: Payer: Self-pay

## 2024-08-14 ENCOUNTER — Ambulatory Visit: Attending: Sports Medicine | Admitting: Physical Therapy

## 2024-08-14 DIAGNOSIS — M5442 Lumbago with sciatica, left side: Secondary | ICD-10-CM | POA: Diagnosis present

## 2024-08-14 DIAGNOSIS — G8929 Other chronic pain: Secondary | ICD-10-CM | POA: Diagnosis present

## 2024-08-14 DIAGNOSIS — M5441 Lumbago with sciatica, right side: Secondary | ICD-10-CM | POA: Diagnosis present

## 2024-08-14 DIAGNOSIS — M898X1 Other specified disorders of bone, shoulder: Secondary | ICD-10-CM | POA: Diagnosis present

## 2024-08-14 NOTE — Therapy (Signed)
 OUTPATIENT PHYSICAL THERAPY THORACOLUMBAR EVALUATION  Patient Name: William Bowman MRN: [id number] DOB:25-Sep-1976, 48 y.o., male Today's Date: 08/14/2024   PT End of Session - 08/14/24 1501     Visit Number 1    Number of Visits --   1-2x/week   Date for PT Re-Evaluation 10/09/24    Authorization Type [employer]    PT Start Time 1500    PT Stop Time 1550    PT Time Calculation (min) 50 min          Past Medical History:  Diagnosis Date   Allergy     Anxiety    Asthma    Depression    Eczema    Pneumonia    Past Surgical History:  Procedure Laterality Date   NO PAST SURGERIES     Patient Active Problem List   Diagnosis Date Noted   Elevated blood pressure reading 02/05/2023   Snoring 04/13/2021   Right upper quadrant abdominal pain 10/14/2019   Seasonal and perennial allergic rhinoconjunctivitis 06/16/2019   Shortness of breath 04/16/2019   Anxiety 01/21/2019   Bursitis of right shoulder 06/05/2018   Polyarthralgia 04/15/2018   AC (acromioclavicular) arthritis 04/15/2018   Low back pain with right-sided sciatica 04/15/2018    PCP: Mercer Clotilda SAUNDERS, MD  REFERRING PROVIDER: Leonce Katz, DO  THERAPY DIAG:  Chronic bilateral low back pain with bilateral sciatica - Plan: PT plan of care cert/re-cert  Pain of right scapula - Plan: PT plan of care cert/re-cert  REFERRING DIAG: Chronic bilateral low back pain with bilateral sciatica [M54.42, M54.41, G89.29], Pain of right scapula [M89.8X1]   Rationale for Evaluation and Treatment:  Rehabilitation  SUBJECTIVE:  PERTINENT PAST HISTORY:  Polyarthralgia, anxiety, asthma       PRECAUTIONS: None  WEIGHT BEARING RESTRICTIONS No  FALLS:  Has patient fallen in last 6 months? No, Number of falls: 0  MOI/History of condition:  Onset date: 1997  SUBJECTIVE STATEMENT  Pt is a 48 y.o. male who presents to clinic with chief complaint of medial R shoulder blade pain which started in 1997 when he was  lifting something at work.  He feels like he has a knot under his shoulder blade.  He feels like his shoulder girdle is tight and limited.  He describes the feeling as discomfort more than pain.  He has concurrent low back pain with radiation to R LE to the knee.  This started when he popped his back several years ago.  He feels that his gait is off.  He feels that his body is shifted.  He reports a feeling of tightness and pain on his R lateral thigh.  He uses an inversion table which is not helpful.  He has never tried physical therapy.  He does not participate in structured exercise.  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  Pt reports the discomfort is significantly affecting his quality of life.  Pain:  Are you having pain? Yes Pain location: neck R shoulder blade pain NPRS scale:  Best: 9/10, Worst: 9/10 Aggravating factors: pt reports none Relieving factors: pt reports none Pain description: constant  Are you having pain? Yes Pain location: low back pain  NPRS scale:  Best: 9/10, Worst: 9/10 Aggravating factors: pt reports none Relieving factors: pt reports none Pain description: constant  Occupation: Garment/textile technologist: NA  Hand Dominance: NA  Patient Goals/Specific Activities: reduce feeling of tightness and improve comfort    OBJECTIVE:   DIAGNOSTIC FINDINGS:  MRI results bending  GENERAL OBSERVATION/GAIT: L hip drop in gait, pec major appears WNL length  SENSATION: Light touch: Deficits R thigh   Cervical ROM  ROM ROM  (Eval)  Flexion 20  Extension 20  Right lateral flexion 45  Left lateral flexion 30* R UT  Right rotation 40  Left rotation 40  Flexion rotation (normal is 30 degrees)   Flexion rotation (normal is 30 degrees)     (Blank rows = not tested, N = WNL, * = concordant pain)  LUMBAR AROM  AROM AROM  (Eval)  Flexion Fingertips to mid shin  Extension WNL  Right lateral flexion WNL, w/ concordant pain  Left lateral flexion  WNL, w/ concordant pain  Right rotation limited by 50%  Left rotation WNL    (Blank rows = not tested)   LE MMT:  MMT Right (Eval) Left (Eval)  Hip flexion (L2, L3) 4+ 4+  Knee extension (L3) 4 4+  Knee flexion    Hip abduction 4 4  Hip extension    Hip external rotation    Hip internal rotation    Hip adduction    Ankle dorsiflexion (L4)    Ankle plantarflexion (S1)    Ankle inversion    Ankle eversion    Great Toe ext (L5)    Grossly     (Blank rows = not tested, score listed is out of 5 possible points.  N = WNL, D = diminished, C = clear for gross weakness with myotome testing, * = concordant pain with testing)  UPPER EXTREMITY AROM:  ROM Right (Eval) Left (Eval)  Shoulder flexion symmetrical symmetrical  Shoulder abduction    Shoulder internal rotation    Shoulder external rotation    Functional IR    Functional ER    Shoulder extension    Elbow extension    Elbow flexion     (Blank rows = not tested, N = WNL, * = concordant pain with testing)  UPPER EXTREMITY MMT:  MMT Right (Eval) Left (Eval)  Shoulder flexion 4+ 4+  Shoulder abduction (C5)    Shoulder ER 5 5  Shoulder IR 5 5  Middle trapezius 4 3+  Lower trapezius    Shoulder extension    Grip strength    Shoulder shrug (C4)    Elbow flexion (C6)    Elbow ext (C7)    Thumb ext (C8)    Finger abd (T1)    Grossly     (Blank rows = not tested, score listed is out of 5 possible points.  N = WNL, D = diminished, C = clear for gross weakness with myotome testing, * = concordant pain with testing)  SPECIAL TESTS:  Slump: L (-), R (+)    PALPATION:   TTP R medial boarder of scapula and R UT  PATIENT SURVEYS:  ODI: 8/50  TODAY'S TREATMENT  Therapeutic Exercise: Creating, reviewing, and completing below HEP  PATIENT EDUCATION ([place]/[facility]):  POC, diagnosis, prognosis, HEP, and outcome measures.  Pt educated via explanation, demonstration, and handout (HEP).  Pt confirms understanding verbally.    HOME EXERCISE PROGRAM: Access Code: [email or web address] URL: [email or web address] Date: 08/14/2024 Prepared by: Helene Gasmen  Exercises - Sidelying Open Book Thoracic Lumbar Rotation and Extension  - 1 x daily - 7 x weekly - 2 sets - 10 reps - 5'' hold - Supine Shoulder Horizontal Abduction with Resistance  - 1 x daily - 7 x weekly - 2 sets - 10 reps - Supine Bridge  -  1 x daily - 7 x weekly - 2 sets - 10 reps - 2-3 seconds hold  Treatment priorities   Eval        Thoracic mobility        Core and periscapular strengthening        Mid and lower trap strenthening                          ASSESSMENT:  CLINICAL IMPRESSION: Ender is a 48 y.o. male who presents to clinic with signs and sxs consistent with chronic R periscapular and low back pain with radiation into R LE not below the knee.   He has muscle tenderness of the periscapular muscles, particularly UT and mid trap/rhomboid region.  He is (+) for neural tension on the R and MOI could be consistent with nerve root involvement.   Malick will benefit from skilled PT to address relevant deficits and reduce pain to improve quality of line.   OBJECTIVE IMPAIRMENTS: Pain, lumbar ROM, cervical ROM, UE and LE strength, LE strength, core strength  ACTIVITY LIMITATIONS: standing, sleeping, bending, walking, work, reaching  PERSONAL FACTORS: See medical history and pertinent history   REHAB POTENTIAL: Good  CLINICAL DECISION MAKING: Evolving/moderate complexity  EVALUATION COMPLEXITY: Moderate   GOALS:   SHORT TERM GOALS: Target date: 09/11/2024   Caulder will be >75% HEP compliant to improve carryover between sessions and facilitate independent management of condition  Evaluation: ongoing Goal status: INITIAL   LONG TERM GOALS: Target date: 10/09/2024   Marque will self report >/= 50% decrease in pain of low back and R periscapular region from evaluation to improve function in daily  tasks  Evaluation/Baseline: 9/10 max pain Goal status: INITIAL   2.  Brenn will show a >/= 6 pt improvement in their ODI score (MCID is 12% or 6/50 pts) as a proxy for functional improvement   Evaluation/Baseline: 8 pts Goal status: INITIAL   3.  Ediberto will be able to complete work, not limited by pain  Evaluation/Baseline: limited by 9/10 pain Goal status: INITIAL   4.  Keonta will report confidence in self management of condition at time of discharge with advanced HEP  Evaluation/Baseline: unable to self manage Goal status: INITIAL    5.  Dhanvin will improve the following MMTs to >/=4/5 to show improvement in strength:  mid trap strength   Evaluation/Baseline: see chart in note Goal status: INITIAL   PLAN: PT FREQUENCY: 1-2x/week  PT DURATION: 8 weeks  PLANNED INTERVENTIONS:  97164- PT Re-evaluation, 97110-Therapeutic exercises, 97530- Therapeutic activity, 97112- Neuromuscular re-education, 97535- Self Care, 02859- Manual therapy, [id number]- Gait training, [id number]- Aquatic Therapy, [phone]- Electrical stimulation (manual), [id number]- Vasopneumatic device, [id number]- Traction (mechanical), [id number]- Ionotophoresis 4mg /ml Dexamethasone, Taping, Dry Needling, Joint manipulation, and Spinal manipulation.   Amyra Vantuyl PT, DPT 08/14/2024, 4:14 PM  I just finished a MCD eval/recert.  Name: Thomson Herbers  MRN: [id number] Please request 1x/week for 8 weeks.  Check all conditions that are expected to impact treatment: Musculoskeletal disorders   I DID put a charge in.  Check all possible CPT codes: 02889- Therapeutic Exercise, [phone]- Neuro Re-education, [phone] - Gait Training, [phone] - Manual Therapy, 97530 - Therapeutic Activities, 97535 - Self Care, [phone] - Re-evaluation, [id number] - Mechanical traction, and [phone] - Aquatic therapy   Thank you!  MCD - Secure

## 2024-08-28 ENCOUNTER — Ambulatory Visit: Attending: Sports Medicine | Admitting: Physical Therapy

## 2024-08-28 ENCOUNTER — Encounter: Payer: Self-pay | Admitting: Physical Therapy

## 2024-08-28 DIAGNOSIS — G8929 Other chronic pain: Secondary | ICD-10-CM | POA: Diagnosis present

## 2024-08-28 DIAGNOSIS — M898X1 Other specified disorders of bone, shoulder: Secondary | ICD-10-CM | POA: Insufficient documentation

## 2024-08-28 DIAGNOSIS — M5442 Lumbago with sciatica, left side: Secondary | ICD-10-CM | POA: Insufficient documentation

## 2024-08-28 DIAGNOSIS — M5441 Lumbago with sciatica, right side: Secondary | ICD-10-CM | POA: Diagnosis present

## 2024-08-28 NOTE — Therapy (Signed)
 OUTPATIENT PHYSICAL THERAPY THORACOLUMBAR TREATMENT   Patient Name: William Bowman MRN: [id number] DOB:01-Jun-1976, 48 y.o., male Today's Date: 08/28/2024   PT End of Session - 08/28/24 1449     Visit Number 2    Number of Visits --   1-2x/week   Date for PT Re-Evaluation 10/09/24    Authorization Type [employer]    Authorization Time Period 08/14/24-10/12/24    Authorization - Visit Number 2    Authorization - Number of Visits 5    PT Start Time 0245    PT Stop Time 0330    PT Time Calculation (min) 45 min          Past Medical History:  Diagnosis Date   Allergy     Anxiety    Asthma    Depression    Eczema    Pneumonia    Past Surgical History:  Procedure Laterality Date   NO PAST SURGERIES     Patient Active Problem List   Diagnosis Date Noted   Elevated blood pressure reading 02/05/2023   Snoring 04/13/2021   Right upper quadrant abdominal pain 10/14/2019   Seasonal and perennial allergic rhinoconjunctivitis 06/16/2019   Shortness of breath 04/16/2019   Anxiety 01/21/2019   Bursitis of right shoulder 06/05/2018   Polyarthralgia 04/15/2018   AC (acromioclavicular) arthritis 04/15/2018   Low back pain with right-sided sciatica 04/15/2018    PCP: Mercer Clotilda SAUNDERS, MD  REFERRING PROVIDER: Mercer Clotilda SAUNDERS, MD  THERAPY DIAG:  Chronic bilateral low back pain with bilateral sciatica  Pain of right scapula  REFERRING DIAG: Chronic bilateral low back pain with bilateral sciatica [M54.42, M54.41, G89.29], Pain of right scapula [M89.8X1]   Rationale for Evaluation and Treatment:  Rehabilitation  SUBJECTIVE:  PERTINENT PAST HISTORY:  Polyarthralgia, anxiety, asthma       PRECAUTIONS: None  WEIGHT BEARING RESTRICTIONS No  FALLS:  Has patient fallen in last 6 months? No, Number of falls: 0  MOI/History of condition:  Onset date: 1997  SUBJECTIVE STATEMENT I am about the same. Doing the exercises.     EVAL: Pt is a 48 y.o. male who presents to  clinic with chief complaint of medial R shoulder blade pain which started in 1997 when he was lifting something at work.  He feels like he has a knot under his shoulder blade.  He feels like his shoulder girdle is tight and limited.  He describes the feeling as discomfort more than pain.  He has concurrent low back pain with radiation to R LE to the knee.  This started when he popped his back several years ago.  He feels that his gait is off.  He feels that his body is shifted.  He reports a feeling of tightness and pain on his R lateral thigh.  He uses an inversion table which is not helpful.  He has never tried physical therapy.  He does not participate in structured exercise.  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  Pt reports the discomfort is significantly affecting his quality of life.  Pain:  Are you having pain? Yes Pain location: neck R shoulder blade pain NPRS scale:  Best: 9/10, Worst: 9/10 Aggravating factors: pt reports none Relieving factors: pt reports none Pain description: constant  Are you having pain? Yes Pain location: low back pain  NPRS scale:  Best: 9/10, Worst: 9/10 Aggravating factors: pt reports none Relieving factors: pt reports none Pain description: constant  Occupation: Garment/textile technologist: NA  Hand Dominance: NA  Patient Goals/Specific Activities: reduce feeling of tightness and improve comfort    OBJECTIVE:   DIAGNOSTIC FINDINGS:  MRI results bending  GENERAL OBSERVATION/GAIT: L hip drop in gait, pec major appears WNL length  SENSATION: Light touch: Deficits R thigh   Cervical ROM  ROM ROM  (Eval)  Flexion 20  Extension 20  Right lateral flexion 45  Left lateral flexion 30* R UT  Right rotation 40  Left rotation 40  Flexion rotation (normal is 30 degrees)   Flexion rotation (normal is 30 degrees)     (Blank rows = not tested, N = WNL, * = concordant pain)  LUMBAR AROM  AROM AROM  (Eval)  Flexion Fingertips to  mid shin  Extension WNL  Right lateral flexion WNL, w/ concordant pain  Left lateral flexion WNL, w/ concordant pain  Right rotation limited by 50%  Left rotation WNL    (Blank rows = not tested)   LE MMT:  MMT Right (Eval) Left (Eval)  Hip flexion (L2, L3) 4+ 4+  Knee extension (L3) 4 4+  Knee flexion    Hip abduction 4 4  Hip extension    Hip external rotation    Hip internal rotation    Hip adduction    Ankle dorsiflexion (L4)    Ankle plantarflexion (S1)    Ankle inversion    Ankle eversion    Great Toe ext (L5)    Grossly     (Blank rows = not tested, score listed is out of 5 possible points.  N = WNL, D = diminished, C = clear for gross weakness with myotome testing, * = concordant pain with testing)  UPPER EXTREMITY AROM:  ROM Right (Eval) Left (Eval)  Shoulder flexion symmetrical symmetrical  Shoulder abduction    Shoulder internal rotation    Shoulder external rotation    Functional IR    Functional ER    Shoulder extension    Elbow extension    Elbow flexion     (Blank rows = not tested, N = WNL, * = concordant pain with testing)  UPPER EXTREMITY MMT:  MMT Right (Eval) Left (Eval)  Shoulder flexion 4+ 4+  Shoulder abduction (C5)    Shoulder ER 5 5  Shoulder IR 5 5  Middle trapezius 4 3+  Lower trapezius    Shoulder extension    Grip strength    Shoulder shrug (C4)    Elbow flexion (C6)    Elbow ext (C7)    Thumb ext (C8)    Finger abd (T1)    Grossly     (Blank rows = not tested, score listed is out of 5 possible points.  N = WNL, D = diminished, C = clear for gross weakness with myotome testing, * = concordant pain with testing)  SPECIAL TESTS:  Slump: L (-), R (+)    PALPATION:   TTP R medial boarder of scapula and R UT  PATIENT SURVEYS:  ODI: 8/50  TODAY'S TREATMENT  [facility] Adult PT Treatment:                                                DATE: 08/28/24 Therapeutic Exercise: UBE level 3 x 2.5 min each way  Thoracic ext in  chair over soft foam roller , hands behind head Open books  Bridge  LTR  PPT Hip flexor thomas test stretch- significant tightness Supine horiz abdct Blue Supine Shoulder ER Blue band  Supine star pattern BTB   EVAL:  Therapeutic Exercise: Creating, reviewing, and completing below HEP  PATIENT EDUCATION ([place]/[facility]):  POC, diagnosis, prognosis, HEP, and outcome measures.  Pt educated via explanation, demonstration, and handout (HEP).  Pt confirms understanding verbally.   HOME EXERCISE PROGRAM: Access Code: [email or web address] URL: [email or web address] Date: 08/14/2024 Prepared by: Helene Gasmen  Exercises - Sidelying Open Book Thoracic Lumbar Rotation and Extension  - 1 x daily - 7 x weekly - 2 sets - 10 reps - 5'' hold - Supine Shoulder Horizontal Abduction with Resistance  - 1 x daily - 7 x weekly - 2 sets - 10 reps - Supine Bridge  - 1 x daily - 7 x weekly - 2 sets - 10 reps - 2-3 seconds hold  Treatment priorities   Eval 08/28/24       Thoracic mobility        Core and periscapular strengthening        Mid and lower trap strenthening                 Hip flexor/quad stretching /ab strength         ASSESSMENT:  CLINICAL IMPRESSION: Pt reports he is about the same. Has been compliant with HEP and also does other exercises he finds on tick tock. Continued with thoracic and lumbar mobility and trap strengthening. Experienced anterior hip tightness with pelvic tilts and bridges. Significant tightness in hip flexors with thomas test. Updated HEP.    EVAL: William Bowman is a 48 y.o. male who presents to clinic with signs and sxs consistent with chronic R periscapular and low back pain with radiation into R LE not below the knee.   He has muscle tenderness of the periscapular muscles, particularly UT and mid trap/rhomboid region.  He is (+) for neural tension on the R and MOI could be consistent with nerve root involvement.   William Bowman will benefit from skilled PT to address relevant  deficits and reduce pain to improve quality of line.   OBJECTIVE IMPAIRMENTS: Pain, lumbar ROM, cervical ROM, UE and LE strength, LE strength, core strength  ACTIVITY LIMITATIONS: standing, sleeping, bending, walking, work, reaching  PERSONAL FACTORS: See medical history and pertinent history   REHAB POTENTIAL: Good  CLINICAL DECISION MAKING: Evolving/moderate complexity  EVALUATION COMPLEXITY: Moderate   GOALS:   SHORT TERM GOALS: Target date: 09/11/2024   William Bowman will be >75% HEP compliant to improve carryover between sessions and facilitate independent management of condition  Evaluation: ongoing Goal status: INITIAL   LONG TERM GOALS: Target date: 10/09/2024   William Bowman will self report >/= 50% decrease in pain of low back and R periscapular region from evaluation to improve function in daily tasks  Evaluation/Baseline: 9/10 max pain Goal status: INITIAL   2.  William Bowman will show a >/= 6 pt improvement in their ODI score (MCID is 12% or 6/50 pts) as a proxy for functional improvement   Evaluation/Baseline: 8 pts Goal status: INITIAL   3.  William Bowman will be able to complete work, not limited by pain  Evaluation/Baseline: limited by 9/10 pain Goal status: INITIAL   4.  William Bowman will report confidence in self management of condition at time of discharge with advanced HEP  Evaluation/Baseline: unable to self manage Goal status: INITIAL    5.  William Bowman will improve the following MMTs to >/=4/5 to show improvement in strength:  mid trap strength   Evaluation/Baseline: see chart in note Goal status: INITIAL   PLAN: PT FREQUENCY: 1-2x/week  PT DURATION: 8 weeks  PLANNED INTERVENTIONS:  97164- PT Re-evaluation, 97110-Therapeutic exercises, 97530- Therapeutic activity, [id number]- Neuromuscular re-education, 97535- Self Care, 02859- Manual therapy, [id number]- Gait training, [phone]- Aquatic Therapy, [phone]- Electrical stimulation (manual), [id number]- Vasopneumatic device, [id number]-  Traction (mechanical), [id number]- Ionotophoresis 4mg /ml Dexamethasone, Taping, Dry Needling, Joint manipulation, and Spinal manipulation.   Karl Reinhartsen PT, DPT 08/28/2024, 3:54 PM  I just finished a MCD eval/recert.  Name: Kamryn Gauthier  MRN: [id number] Please request 1x/week for 8 weeks.  Check all conditions that are expected to impact treatment: Musculoskeletal disorders   I DID put a charge in.  Check all possible CPT codes: 02889- Therapeutic Exercise, [phone]- Neuro Re-education, [phone] - Gait Training, [phone] - Manual Therapy, 97530 - Therapeutic Activities, 97535 - Self Care, [phone] - Re-evaluation, [id number] - Mechanical traction, and [phone] - Aquatic therapy   Thank you!  MCD - Secure

## 2024-08-30 ENCOUNTER — Other Ambulatory Visit: Payer: Self-pay | Admitting: Sports Medicine

## 2024-09-02 NOTE — Progress Notes (Unsigned)
 [facility] [address] [place] [address] Phone: [phone]   Assessment and Plan:     1. Chronic bilateral low back pain with bilateral sciatica (Primary) 2. Pain of right scapula 3. Polyarthralgia 4. DDD (degenerative disc disease), thoracic 5. Lumbar facet arthropathy -Chronic with exacerbation, subsequent visit - Overall relatively unchanged symptoms with possible mild improvement.  Patient continues to complain of multiple areas of musculoskeletal pain including right scapula, right thoracic region, tightness through bilateral hips and hip flexors, gluteal musculature, low back pain.  Still unclear etiology of chronic pain.  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  Recommend limiting chronic NSAIDs to 1-2 doses per week to prevent long-term side effects.  - Use Tylenol  500 to 1000 mg tablets 2-3 times a day for day-to-day pain relief     Pertinent previous records reviewed include lumbar MRI 08/11/2024, lab work 07/31/2024   Follow Up: 6 to 8 weeks for reevaluation.  Could discuss gabapentin  versus Cymbalta versus OMT   Subjective:   I, Mc Hollen, am serving as a Neurosurgeon for Doctor Morene Mace  Chief Complaint: right  It band pain    HPI:    07/31/2024 Patient is a 48 year old male with right IT band pain. Patient states that this year it started getting worse around January.  1997 and 2002 and popped his back by twisting and one day he did it and it caused pain. It felt like he broke his pain and in severe pain. It has been an issue since 2001. It starts off as tightness and discomfort but now it is pain. It feels like he is out of alignment on the right side. Pain is located in shoulder, hips, groin, and down the leg. If he relaxes his core the pain in his right leg is intense. Pain wakes him up at night and prevents him from going to sleep.    Duration?chronic about 24 years  Did you have an Injury to cause this pain? Taking Medication for pain? Aleve  Numbness or Tingling? yes Does the pain Radiate? yes Altered gait or use?yes ROM/ impairment of movement?yes   09/03/2024 Patient states pain is the same. Now he has upper trap tightness    Relevant Historical Information: None pertinent  Additional pertinent review of systems negative.   Current Outpatient Medications:    meloxicam  (MOBIC ) 15 MG tablet, Take 1 tablet (15 mg total) by mouth daily as needed for pain., Disp: 30 tablet, Rfl: 0   amLODipine  (NORVASC ) 2.5 MG tablet, Take 1 tablet (2.5 mg total) by mouth daily., Disp: 90 tablet, Rfl: 1   cetirizine (ZYRTEC) 10 MG tablet, Take 10 mg by mouth daily as needed for allergies., Disp: , Rfl:    Cyanocobalamin  (VITAMIN B-12) 1000 MCG/15ML LIQD, Take by mouth., Disp: , Rfl:    meloxicam  (MOBIC ) 15 MG tablet, Take 1 tablet (15 mg total) by mouth daily., Disp: 30 tablet, Rfl: 0   triamcinolone  ointment (KENALOG ) 0.5 %, Apply 1 Application topically 2 (two) times daily., Disp:  60 g, Rfl: 2   Vitamin D , Ergocalciferol , (DRISDOL ) 1.25 MG (50000 UNIT) CAPS capsule, Take 1 capsule (50,000 Units total) by mouth every 7 (seven) days., Disp: 12 capsule, Rfl: 0   Objective:     Vitals:   09/03/24 0827  Pulse: 93  SpO2: 97%  Weight: 245 lb (111.1 kg)  Height: 5' 7 (1.702 m)      Body mass index is 38.37 kg/m.    Physical Exam:    Gen: Appears well, nad,  nontoxic and pleasant Psych: Alert and oriented, appropriate mood and affect Neuro: sensation intact, strength is 5/5 in upper and lower extremities, muscle tone wnl Skin: no susupicious lesions or rashes   Back - Normal skin, Spine with normal alignment and no deformity.   No tenderness to vertebral process palpation.   Paraspinous muscles are not tender and without spasm NTTP gluteal musculature Straight leg raise negative for radicular symptoms, though reproduced strain through lower back, hips, gluteal musculature Trendelenberg negative Piriformis Test negative for radicular symptoms, though subjective tightness bilaterally Gait normal     Electronically signed by:  Odis Mace D.CLEMENTEEN [facility] 8:45 AM 09/03/24

## 2024-09-03 ENCOUNTER — Encounter: Payer: Self-pay | Admitting: Physical Therapy

## 2024-09-03 ENCOUNTER — Ambulatory Visit (INDEPENDENT_AMBULATORY_CARE_PROVIDER_SITE_OTHER): Payer: Self-pay | Admitting: Sports Medicine

## 2024-09-03 ENCOUNTER — Ambulatory Visit: Admitting: Physical Therapy

## 2024-09-03 VITALS — HR 93 | Ht 67.0 in | Wt 245.0 lb

## 2024-09-03 DIAGNOSIS — M2550 Pain in unspecified joint: Secondary | ICD-10-CM | POA: Diagnosis not present

## 2024-09-03 DIAGNOSIS — M5442 Lumbago with sciatica, left side: Secondary | ICD-10-CM | POA: Diagnosis not present

## 2024-09-03 DIAGNOSIS — M5441 Lumbago with sciatica, right side: Secondary | ICD-10-CM

## 2024-09-03 DIAGNOSIS — M5134 Other intervertebral disc degeneration, thoracic region: Secondary | ICD-10-CM | POA: Diagnosis not present

## 2024-09-03 DIAGNOSIS — M47816 Spondylosis without myelopathy or radiculopathy, lumbar region: Secondary | ICD-10-CM

## 2024-09-03 DIAGNOSIS — M898X1 Other specified disorders of bone, shoulder: Secondary | ICD-10-CM

## 2024-09-03 DIAGNOSIS — G8929 Other chronic pain: Secondary | ICD-10-CM

## 2024-09-03 MED ORDER — MELOXICAM 15 MG PO TABS: 15.0000 mg | ORAL_TABLET | Freq: Every day | ORAL | 0 refills | Status: DC | PRN

## 2024-09-03 NOTE — Patient Instructions (Signed)
-   Use meloxicam  15 mg daily as needed for pain.  Recommend limiting chronic NSAIDs to 1-2 doses per week to prevent long-term side effects.  Meloxicam  refill   Tylenol  [phone] mg 2-3 times a day for pain relief   Continue HEP and PT   6-8 week follow up

## 2024-09-03 NOTE — Therapy (Addendum)
 PHYSICAL THERAPY UNPLANNED DISCHARGE SUMMARY   Visits from Start of Care: 3  Current functional level related to goals / functional outcomes: Current status unknown   Remaining deficits: Current status unknown   Education / Equipment: Pt has not returned since visit listed below  Patient goals were not assessed. Patient is being discharged due to not returning since the last visit.  (the note below was addended to include the above D/C summary on 10/13/24)  OUTPATIENT PHYSICAL THERAPY THORACOLUMBAR TREATMENT   Patient Name: William Bowman MRN: [id number] DOB:1976/11/08, 48 y.o., male Today's Date: 09/03/2024   PT End of Session - 09/03/24 1450     Visit Number 3    Number of Visits --   1-2x/week   Date for PT Re-Evaluation 10/09/24    Authorization Type [employer]    Authorization Time Period 08/14/24-10/12/24    Authorization - Visit Number 3    Authorization - Number of Visits 5    PT Start Time 0248    PT Stop Time 0330    PT Time Calculation (min) 42 min          Past Medical History:  Diagnosis Date   Allergy     Anxiety    Asthma    Depression    Eczema    Pneumonia    Past Surgical History:  Procedure Laterality Date   NO PAST SURGERIES     Patient Active Problem List   Diagnosis Date Noted   Elevated blood pressure reading 02/05/2023   Snoring 04/13/2021   Right upper quadrant abdominal pain 10/14/2019   Seasonal and perennial allergic rhinoconjunctivitis 06/16/2019   Shortness of breath 04/16/2019   Anxiety 01/21/2019   Bursitis of right shoulder 06/05/2018   Polyarthralgia 04/15/2018   AC (acromioclavicular) arthritis 04/15/2018   Low back pain with right-sided sciatica 04/15/2018    PCP: Mercer Clotilda SAUNDERS, MD  REFERRING PROVIDER: Leonce Katz, DO  THERAPY DIAG:  Chronic bilateral low back pain with bilateral sciatica  Pain of right scapula  REFERRING DIAG: Chronic bilateral low back pain with bilateral sciatica [M54.42,  M54.41, G89.29], Pain of right scapula [M89.8X1]   Rationale for Evaluation and Treatment:  Rehabilitation  SUBJECTIVE:  PERTINENT PAST HISTORY:  Polyarthralgia, anxiety, asthma       PRECAUTIONS: None  WEIGHT BEARING RESTRICTIONS No  FALLS:  Has patient fallen in last 6 months? No, Number of falls: 0  MOI/History of condition:  Onset date: 1997  SUBJECTIVE STATEMENT Doing the exercises, and I have loosened up some.      EVAL: Pt is a 48 y.o. male who presents to clinic with chief complaint of medial R shoulder blade pain which started in 1997 when he was lifting something at work.  He feels like he has a knot under his shoulder blade.  He feels like his shoulder girdle is tight and limited.  He describes the feeling as discomfort more than pain.  He has concurrent low back pain with radiation to R LE to the knee.  This started when he popped his back several years ago.  He feels that his gait is off.  He feels that his body is shifted.  He reports a feeling of tightness and pain on his R lateral thigh.  He uses an inversion table which is not helpful.  He has never tried physical therapy.  He does not participate in structured exercise.  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  Pt reports the discomfort is  significantly affecting his quality of life.  Pain:  Are you having pain? Yes Pain location: neck R shoulder blade pain NPRS scale:  Best: 9/10, Worst: 9/10 Aggravating factors: pt reports none Relieving factors: pt reports none Pain description: constant  Are you having pain? Yes Pain location: low back pain  NPRS scale:  Best: 9/10, Worst: 9/10 Aggravating factors: pt reports none Relieving factors: pt reports none Pain description: constant  Occupation: Garment/textile technologist: NA  Hand Dominance: NA  Patient Goals/Specific Activities: reduce feeling of tightness and improve comfort    OBJECTIVE:   DIAGNOSTIC FINDINGS:  MRI results  bending  GENERAL OBSERVATION/GAIT: L hip drop in gait, pec major appears WNL length  SENSATION: Light touch: Deficits R thigh   Cervical ROM  ROM ROM  (Eval)  Flexion 20  Extension 20  Right lateral flexion 45  Left lateral flexion 30* R UT  Right rotation 40  Left rotation 40  Flexion rotation (normal is 30 degrees)   Flexion rotation (normal is 30 degrees)     (Blank rows = not tested, N = WNL, * = concordant pain)  LUMBAR AROM  AROM AROM  (Eval)  Flexion Fingertips to mid shin  Extension WNL  Right lateral flexion WNL, w/ concordant pain  Left lateral flexion WNL, w/ concordant pain  Right rotation limited by 50%  Left rotation WNL    (Blank rows = not tested)   LE MMT:  MMT Right (Eval) Left (Eval)  Hip flexion (L2, L3) 4+ 4+  Knee extension (L3) 4 4+  Knee flexion    Hip abduction 4 4  Hip extension    Hip external rotation    Hip internal rotation    Hip adduction    Ankle dorsiflexion (L4)    Ankle plantarflexion (S1)    Ankle inversion    Ankle eversion    Great Toe ext (L5)    Grossly     (Blank rows = not tested, score listed is out of 5 possible points.  N = WNL, D = diminished, C = clear for gross weakness with myotome testing, * = concordant pain with testing)  UPPER EXTREMITY AROM:  ROM Right (Eval) Left (Eval)  Shoulder flexion symmetrical symmetrical  Shoulder abduction    Shoulder internal rotation    Shoulder external rotation    Functional IR    Functional ER    Shoulder extension    Elbow extension    Elbow flexion     (Blank rows = not tested, N = WNL, * = concordant pain with testing)  UPPER EXTREMITY MMT:  MMT Right (Eval) Left (Eval)  Shoulder flexion 4+ 4+  Shoulder abduction (C5)    Shoulder ER 5 5  Shoulder IR 5 5  Middle trapezius 4 3+  Lower trapezius    Shoulder extension    Grip strength    Shoulder shrug (C4)    Elbow flexion (C6)    Elbow ext (C7)    Thumb ext (C8)    Finger abd (T1)     Grossly     (Blank rows = not tested, score listed is out of 5 possible points.  N = WNL, D = diminished, C = clear for gross weakness with myotome testing, * = concordant pain with testing)  SPECIAL TESTS:  Slump: L (-), R (+)    PALPATION:   TTP R medial boarder of scapula and R UT  PATIENT SURVEYS:  ODI: 8/50  TODAY'S TREATMENT  [facility] Adult PT Treatment:                                                DATE: 09/03/24 Therapeutic Exercise: PPT PPT to Bridge  Qped Rocking forward and laterals  POE with h/s curls  Prone hip flexor stretch  Supine thomas stretch  Hooklying ball press/ mini ab crunch Bridge with legs over ball  Figure 4 QL stretch x 2 each  Supine BTB Star pattern   [facility] Adult PT Treatment:                                                DATE: 08/28/24 Therapeutic Exercise: UBE level 3 x 2.5 min each way  Thoracic ext in chair over soft foam roller , hands behind head Open books  Bridge  LTR  PPT Hip flexor thomas test stretch- significant tightness Supine horiz abdct Blue Supine Shoulder ER Blue band  Supine star pattern BTB   EVAL:  Therapeutic Exercise: Creating, reviewing, and completing below HEP  PATIENT EDUCATION (/HM):  POC, diagnosis, prognosis, HEP, and outcome measures.  Pt educated via explanation, demonstration, and handout (HEP).  Pt confirms understanding verbally.   HOME EXERCISE PROGRAM: Access Code: [email or web address] URL: [email or web address] Date: 08/14/2024 Prepared by: William Bowman  Exercises - Sidelying Open Book Thoracic Lumbar Rotation and Extension  - 1 x daily - 7 x weekly - 2 sets - 10 reps - 5'' hold - Supine Shoulder Horizontal Abduction with Resistance  - 1 x daily - 7 x weekly - 2 sets - 10 reps - Supine Bridge  - 1 x daily - 7 x weekly - 2 sets - 10 reps - 2-3 seconds hold - SUPINE Alternating star pattern  - 1 x daily - 7 x weekly - 2-3 sets - 10 reps - Supine bridge with ball  - 1 x daily - 7 x weekly  - 2 sets - 10 reps - 5 hold - Abdominal Press into William Bowman and Roll  - 1 x daily - 7 x weekly - 1-2 sets - 10 reps - 5 hold  Treatment priorities   Eval 08/28/24       Thoracic mobility        Core and periscapular strengthening        Mid and lower trap strenthening                 Hip flexor/quad stretching /ab strength         ASSESSMENT:  CLINICAL IMPRESSION: Pt reports he is about the same. Saw MD and discussed MRI results which had no significant issues. Has bee compliant with HEP. Significant improvement in tolerance to hip flexor stretches and lumbar mobility, demonstrating improved flexibility. MD recommended continuation of PT.      EVAL: William Bowman is a 48 y.o. male who presents to clinic with signs and sxs consistent with chronic R periscapular and low back pain with radiation into R LE not below the knee.   He has muscle tenderness of the periscapular muscles, particularly UT and mid trap/rhomboid region.  He is (+) for neural tension on the R and MOI could be consistent with nerve root involvement.   Javell will benefit from skilled  PT to address relevant deficits and reduce pain to improve quality of line.   OBJECTIVE IMPAIRMENTS: Pain, lumbar ROM, cervical ROM, UE and LE strength, LE strength, core strength  ACTIVITY LIMITATIONS: standing, sleeping, bending, walking, work, reaching  PERSONAL FACTORS: See medical history and pertinent history   REHAB POTENTIAL: Good  CLINICAL DECISION MAKING: Evolving/moderate complexity  EVALUATION COMPLEXITY: Moderate   GOALS:   SHORT TERM GOALS: Target date: 09/11/2024   Kanav will be >75% HEP compliant to improve carryover between sessions and facilitate independent management of condition  Evaluation: ongoing Goal status: INITIAL   LONG TERM GOALS: Target date: 10/09/2024   Taitum will self report >/= 50% decrease in pain of low back and R periscapular region from evaluation to improve function in daily  tasks  Evaluation/Baseline: 9/10 max pain Goal status: INITIAL   2.  Jamiel will show a >/= 6 pt improvement in their ODI score (MCID is 12% or 6/50 pts) as a proxy for functional improvement   Evaluation/Baseline: 8 pts Goal status: INITIAL   3.  Heston will be able to complete work, not limited by pain  Evaluation/Baseline: limited by 9/10 pain Goal status: INITIAL   4.  Trayvond will report confidence in self management of condition at time of discharge with advanced HEP  Evaluation/Baseline: unable to self manage Goal status: INITIAL    5.  Jaedon will improve the following MMTs to >/=4/5 to show improvement in strength:  mid trap strength   Evaluation/Baseline: see chart in note Goal status: INITIAL   PLAN: PT FREQUENCY: 1-2x/week  PT DURATION: 8 weeks  PLANNED INTERVENTIONS:  97164- PT Re-evaluation, 97110-Therapeutic exercises, 97530- Therapeutic activity, [id number]- Neuromuscular re-education, 97535- Self Care, 02859- Manual therapy, [id number]- Gait training, [id number]- Aquatic Therapy, [phone]- Electrical stimulation (manual), [id number]- Vasopneumatic device, [id number]- Traction (mechanical), [id number]- Ionotophoresis 4mg /ml Dexamethasone, Taping, Dry Needling, Joint manipulation, and Spinal manipulation.   Karl Reinhartsen PT, DPT 09/03/2024, 3:35 PM  I just finished a MCD eval/recert.  Name: William Bowman  MRN: [id number] Please request 1x/week for 8 weeks.  Check all conditions that are expected to impact treatment: Musculoskeletal disorders   I DID put a charge in.  Check all possible CPT codes: 02889- Therapeutic Exercise, [phone]- Neuro Re-education, [phone] - Gait Training, [phone] - Manual Therapy, 97530 - Therapeutic Activities, 97535 - Self Care, [phone] - Re-evaluation, [id number] - Mechanical traction, and [id number] - Aquatic therapy   Thank you!  MCD - Secure

## 2024-09-08 ENCOUNTER — Ambulatory Visit: Admitting: Sports Medicine

## 2024-09-10 ENCOUNTER — Encounter: Payer: Self-pay | Admitting: Physical Therapy

## 2024-09-10 ENCOUNTER — Ambulatory Visit: Admitting: Physical Therapy

## 2024-09-17 ENCOUNTER — Ambulatory Visit: Payer: Self-pay | Admitting: Physical Therapy

## 2024-09-17 ENCOUNTER — Ambulatory Visit: Admitting: Sports Medicine

## 2024-09-22 ENCOUNTER — Other Ambulatory Visit: Payer: Self-pay

## 2024-09-22 ENCOUNTER — Emergency Department (HOSPITAL_COMMUNITY)
Admission: EM | Admit: 2024-09-22 | Discharge: 2024-09-22 | Discharge status: Against Medical Advice | Attending: Emergency Medicine | Admitting: Emergency Medicine

## 2024-09-22 ENCOUNTER — Encounter (HOSPITAL_COMMUNITY): Payer: Self-pay

## 2024-09-22 ENCOUNTER — Emergency Department (HOSPITAL_COMMUNITY)

## 2024-09-22 DIAGNOSIS — M25512 Pain in left shoulder: Secondary | ICD-10-CM | POA: Insufficient documentation

## 2024-09-22 DIAGNOSIS — M549 Dorsalgia, unspecified: Secondary | ICD-10-CM | POA: Diagnosis not present

## 2024-09-22 DIAGNOSIS — M542 Cervicalgia: Secondary | ICD-10-CM | POA: Diagnosis not present

## 2024-09-22 DIAGNOSIS — R0789 Other chest pain: Secondary | ICD-10-CM | POA: Insufficient documentation

## 2024-09-22 DIAGNOSIS — Z5321 Procedure and treatment not carried out due to patient leaving prior to being seen by health care provider: Secondary | ICD-10-CM | POA: Diagnosis not present

## 2024-09-22 LAB — BASIC METABOLIC PANEL WITH GFR
Anion gap: 13 (ref 5–15)
BUN: 12 mg/dL (ref 6–20)
CO2: 24 mmol/L (ref 22–32)
Calcium: 9.6 mg/dL (ref 8.9–10.3)
Chloride: 102 mmol/L (ref 98–111)
Creatinine, Ser: 0.73 mg/dL (ref 0.61–1.24)
GFR, Estimated: >60 mL/min (ref >60)
Glucose, Bld: 109 mg/dL — ABNORMAL HIGH (ref 70–99)
Potassium: 3.5 mmol/L (ref 3.5–5.1)
Sodium: 139 mmol/L (ref 135–145)

## 2024-09-22 LAB — TROPONIN T, HIGH SENSITIVITY: Troponin T High Sensitivity: <15 ng/L (ref 0–19)

## 2024-09-22 LAB — CBC
HCT: 41.3 % (ref 39.0–52.0)
Hemoglobin: 13.8 g/dL (ref 13.0–17.0)
MCH: 31.2 pg (ref 26.0–34.0)
MCHC: 33.4 g/dL (ref 30.0–36.0)
MCV: 93.2 fL (ref 80.0–100.0)
Platelets: 265 K/uL (ref 150–400)
RBC: 4.43 MIL/uL (ref 4.22–5.81)
RDW: 12.6 % (ref 11.5–15.5)
WBC: 6.1 K/uL (ref 4.0–10.5)
nRBC: 0 % (ref 0.0–0.2)

## 2024-09-22 NOTE — ED Triage Notes (Addendum)
 PT states he is having pain by his scapula, into his shoulder and down in flank on the Left side since Thursday. PT describes the pain a a squeezing sensation.

## 2024-09-23 ENCOUNTER — Ambulatory Visit: Payer: Self-pay

## 2024-09-23 NOTE — Telephone Encounter (Signed)
 Patient as seen in the ED and has an appt 10/1

## 2024-09-23 NOTE — Telephone Encounter (Signed)
 FYI Only or Action Required?: FYI only for provider.  Patient was last seen in primary care on 07/07/2024 by William Clotilda SAUNDERS, MD.  Called Nurse Triage reporting Chest Pain.  Symptoms began several days ago.  Interventions attempted: Other: ER evaluation.  Symptoms are: unchanged.  Triage Disposition: See PCP When Office is Open (Within 3 Days)  Patient/caregiver understands and will follow disposition?: Yes  Copied from CRM #[id number]. Topic: Clinical - Red Word Triage >> Sep 23, 2024  3:00 PM [place] J wrote: [facility] that prompted transfer to Nurse Triage: Pain in chest, back, and left arm. Reason for Disposition  [1] MODERATE back pain (e.g., interferes with normal activities) AND [2] present > 3 days  Additional Information  Negative: [1] Chest pain lasts > 5 minutes AND [2] age > 34    Evaluated at ER, was advised to follow up with pcp  Negative: [1] Chest pain lasts > 5 minutes AND [2] occurred in past 3 days (72 hours) (Exception: Feels exactly the same as previously diagnosed heartburn and has accompanying sour taste in mouth.)    Evaluated 09/22/24 at ER  Answer Assessment - Initial Assessment Questions Additional info: Patient reports he was evaluated at [facility] ED yesterday, cardiac process ruled out and follow up with pcp. Scheduled hospital follow up with pcp on 09/24/24. Advised patient if pain becomes more intense, develops shortness of breath, headaches or other new concerning symptoms to return to ER, patient in agreement.    1. LOCATION: Where does it hurt?       Chest, back, left arm  2. RADIATION: Does the pain go anywhere else? (e.g., into neck, jaw, arms, back)     back 3. ONSET: When did the chest pain begin? (Minutes, hours or days)      Friday  4. PATTERN: Does the pain come and go, or has it been constant since it started?  Does it get worse with exertion?      constant 5. DURATION: How long does it last (e.g., seconds, minutes, hours)      Constant since Friday 09/19/24 6. SEVERITY: How bad is the pain?  (e.g., Scale 1-10; mild, moderate, or severe)     Improving  7. CARDIAC RISK FACTORS: Do you have any history of heart problems or risk factors for heart disease? (e.g., angina, prior heart attack; diabetes, high blood pressure, high cholesterol, smoker, or strong family history of heart disease)     yes 8. PULMONARY RISK FACTORS: Do you have any history of lung disease?  (e.g., blood clots in lung, asthma, emphysema, birth control pills)      9. CAUSE: What do you think is causing the chest pain?     Unsure. ER ruled out cardiac  10. OTHER SYMPTOMS: Do you have any other symptoms? (e.g., dizziness, nausea, vomiting, sweating, fever, difficulty breathing, cough)       Denies  Answer Assessment - Initial Assessment Questions 1. ONSET: When did the pain start?     Friday 2. LOCATION: Where is the pain located?     left 3. PAIN: How bad is the pain? (Scale 1-10; or mild, moderate, severe)     moderate 4. WORK OR EXERCISE: Has there been any recent work or exercise that involved this part of the body?      5. CAUSE: What do you think is causing the shoulder pain?     unsure 6. OTHER SYMPTOMS: Do you have any other symptoms? (e.g., neck pain, swelling, rash,  fever, numbness, weakness)     Chest pain, back pain 7. PREGNANCY: Is there any chance you are pregnant? When was your last menstrual period?  Protocols used: Chest Pain-A-AH, Shoulder Pain-A-AH, Back Pain-A-AH

## 2024-09-24 ENCOUNTER — Encounter: Payer: Self-pay | Admitting: Family Medicine

## 2024-09-24 ENCOUNTER — Ambulatory Visit: Admitting: Family Medicine

## 2024-09-24 VITALS — BP 146/90 | HR 107 | Temp 98.1°F | Ht 67.0 in | Wt 240.8 lb

## 2024-09-24 DIAGNOSIS — M25512 Pain in left shoulder: Secondary | ICD-10-CM | POA: Diagnosis not present

## 2024-09-24 DIAGNOSIS — M79622 Pain in left upper arm: Secondary | ICD-10-CM

## 2024-09-24 DIAGNOSIS — M47812 Spondylosis without myelopathy or radiculopathy, cervical region: Secondary | ICD-10-CM | POA: Diagnosis not present

## 2024-09-24 DIAGNOSIS — M542 Cervicalgia: Secondary | ICD-10-CM

## 2024-09-24 DIAGNOSIS — M5412 Radiculopathy, cervical region: Secondary | ICD-10-CM | POA: Diagnosis not present

## 2024-09-24 DIAGNOSIS — R202 Paresthesia of skin: Secondary | ICD-10-CM

## 2024-09-24 MED ORDER — KETOROLAC TROMETHAMINE 60 MG/2ML IM SOLN
60.0000 mg | Freq: Once | INTRAMUSCULAR | Status: AC
  Administered 2024-09-24: 60 mg via INTRAMUSCULAR

## 2024-09-24 MED ORDER — METHYLPREDNISOLONE ACETATE 80 MG/ML IJ SUSP
80.0000 mg | Freq: Once | INTRAMUSCULAR | Status: AC
  Administered 2024-09-24: 80 mg via INTRAMUSCULAR

## 2024-09-24 MED ORDER — PREDNISONE 10 MG PO TABS: ORAL_TABLET | ORAL | 0 refills | Status: AC

## 2024-09-24 NOTE — Progress Notes (Addendum)
 Established Patient Office Visit   Subjective  Patient ID: William Bowman, male    DOB: 10/27/1976  Age: 48 y.o. MRN: [id number]  Chief Complaint  Patient presents with   Acute Visit    Patient came in today for ED follow-up, patient was see 9/29, for chest back and shoulder pain     Pt is a 48 yo male seen for ED f/u.  Pt presented to ED on 09/22/24 for CP.  CXR, EKG, and labs negative.  LWOT.  Sx started Thursday, 6 days ago, while sitting with pain at top of L neck and radiating down to his shoulder, upper chest/upper back, with a squeezing sensation in the back of his arm down to the elbow.  The pain is described as dull and squeezing, particularly intense when relaxing  or resting elbow on the arm of a chair.  Has weakness similar to hitting the 'funny bone,' extending from the elbow to the shoulder and a tightness/pulling sensation in neck.  At times has difficulty lifting L arm without pain.   Tried over-the-counter pain medications, a massage gun, exercises taught by his physical trainer, as well as meloxicam  and gabapentin  without relief.  Difficult to sleep due to the pain waking him up at night  Denies numbness or tingling in his hands, but a vibrating sensation in his left arm upwards to shoulder and neck.  Denies increase in heavy lifting or injury.  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  Has difficulty sitting up straight or relaxing his shoulders without pain.     Patient Active Problem List   Diagnosis Date Noted   Elevated blood pressure reading 02/05/2023   Snoring 04/13/2021   Right upper quadrant abdominal pain 10/14/2019   Seasonal and perennial allergic rhinoconjunctivitis 06/16/2019   Shortness of breath 04/16/2019   Anxiety 01/21/2019   Bursitis of right shoulder 06/05/2018   Polyarthralgia 04/15/2018   AC (acromioclavicular) arthritis 04/15/2018   Low back pain with right-sided sciatica 04/15/2018   Past Medical History:   Diagnosis Date   Allergy     Anxiety    Asthma    Depression    Eczema    Pneumonia    Past Surgical History:  Procedure Laterality Date   NO PAST SURGERIES     Social History   Tobacco Use   Smoking status: Former    Types: Cigarettes   Smokeless tobacco: Never  Vaping Use   Vaping status: Never Used  Substance Use Topics   Alcohol use: Yes    Comment: ocassionally beer and/or liquor   Drug use: Yes    Frequency: 2.0 times per week    Types: Marijuana    Comment: last smoked 12/02/2019   Family History  Adopted: Yes  Problem Relation Age of Onset   Diabetes Mother    Pancreatic cancer Maternal Aunt    Heart disease Maternal Aunt    Diabetes Maternal Uncle    Colon cancer Neg Hx    Stomach cancer Neg Hx    Rectal cancer Neg Hx    No Known Allergies  ROS Negative unless stated above    Objective:     There were no vitals taken for this visit. BP Readings from Last 3 Encounters:  09/22/24 (!) 146/90  07/31/24 130/70  07/07/24 138/78   Wt Readings from Last 3 Encounters:  09/22/24 226 lb (102.5 kg)  09/03/24 245 lb (111.1 kg)  07/31/24 242 lb 12.8 oz (110.1  kg)      Physical Exam Constitutional:      General: He is not in acute distress.    Appearance: Normal appearance.  HENT:     Head: Normocephalic and atraumatic.     Nose: Nose normal.     Mouth/Throat:     Mouth: Mucous membranes are moist.  Cardiovascular:     Rate and Rhythm: Normal rate and regular rhythm.     Heart sounds: Normal heart sounds. No murmur heard.    No gallop.  Pulmonary:     Effort: Pulmonary effort is normal. No respiratory distress.     Breath sounds: Normal breath sounds. No wheezing, rhonchi or rales.  Musculoskeletal:       Arms:     Comments: TTP of anterior L upper chest, shoulder, upper L back to inferior L scapula with radiation uppward.  Negative Tinnel and phalen's.  TTP of medial and lateral L epicondyles causing a tingling sensation.  Skin:    General:  Skin is warm and dry.  Neurological:     Mental Status: He is alert and oriented to person, place, and time.        09/24/2024   11:00 AM 07/07/2024   11:54 AM 06/09/2024    9:33 AM  Depression screen PHQ 2/9  Decreased Interest 0 0 0  Down, Depressed, Hopeless 0 0 1  PHQ - 2 Score 0 0 1  Altered sleeping 2 0 0  Tired, decreased energy 1 1 1   Change in appetite 1 1 2   Feeling bad or failure about yourself  0 0 0  Trouble concentrating 0 0 0  Moving slowly or fidgety/restless 0 0 0  Suicidal thoughts 0 0 0  PHQ-9 Score 4 2 4   Difficult doing work/chores Somewhat difficult Not difficult at all Not difficult at all      09/24/2024   11:00 AM 07/07/2024   11:54 AM 06/09/2024    9:33 AM 02/28/2021    9:52 AM  GAD 7 : Generalized Anxiety Score  Nervous, Anxious, on Edge 0 1 3 3   Control/stop worrying 0 0 0 3  Worry too much - different things 0 0 1 3  Trouble relaxing 3 1 1 3   Restless 0 0 0 0  Easily annoyed or irritable 0 1 0 3  Afraid - awful might happen 0 0 0 3  Total GAD 7 Score 3 3 5 18   Anxiety Difficulty Very difficult Not difficult at all Not difficult at all Somewhat difficult     No results found for any visits on 09/24/24.    Assessment & Plan:   Cervical radiculopathy -     methylPREDNISolone  Acetate -     Ketorolac  Tromethamine  -     predniSONE ; Take 6 tabs every morning for 3 days, 5 tabs for 3 days, 4 tabs for 2 days, 3 tabs for 2 days, 2 tabs for 2 days, 1 tab for 2 days.  Dispense: 53 tablet; Refill: 0 -     MR CERVICAL SPINE WO CONTRAST; Future  Acute pain of left shoulder -     predniSONE ; Take 6 tabs every morning for 3 days, 5 tabs for 3 days, 4 tabs for 2 days, 3 tabs for 2 days, 2 tabs for 2 days, 1 tab for 2 days.  Dispense: 53 tablet; Refill: 0 -     MR CERVICAL SPINE WO CONTRAST; Future  Neck pain -     predniSONE ; Take 6 tabs every morning  for 3 days, 5 tabs for 3 days, 4 tabs for 2 days, 3 tabs for 2 days, 2 tabs for 2 days, 1 tab for 2  days.  Dispense: 53 tablet; Refill: 0 -     MR CERVICAL SPINE WO CONTRAST; Future  Pain of left upper arm -     predniSONE ; Take 6 tabs every morning for 3 days, 5 tabs for 3 days, 4 tabs for 2 days, 3 tabs for 2 days, 2 tabs for 2 days, 1 tab for 2 days.  Dispense: 53 tablet; Refill: 0 -     MR CERVICAL SPINE WO CONTRAST; Future  Paresthesia -     MR CERVICAL SPINE WO CONTRAST; Future  Spondylosis of cervical spine -     methylPREDNISolone  Acetate -     Ketorolac  Tromethamine  -     predniSONE ; Take 6 tabs every morning for 3 days, 5 tabs for 3 days, 4 tabs for 2 days, 3 tabs for 2 days, 2 tabs for 2 days, 1 tab for 2 days.  Dispense: 53 tablet; Refill: 0 -     MR CERVICAL SPINE WO CONTRAST; Future   Acute symptoms of neck and chest pain.  Presented to ED but left LWOT.  CXR, EKG and labs on 09/22/24 were negative.  Reassuring symptoms less likely to be cardiac in nature.  Concern for worsened cervical radiculopathy based on prior imaging.  X-ray C-spine 04/15/2018 with cervical spondylosis and degenerative disc disease with mild degenerative retrolisthesis at C4-C5 and C5-C6 loss of disc height and intervertebral spurring also noted and findings of x-ray.  Given patient's continued symptoms obtain MRI of C-spine to further evaluate cervical radiculopathy.  Steroid and Toradol  given in clinic.  Start prednisone  taper tomorrow.  Discussed PT after imaging results available.  Given precautions.  Return if symptoms worsen or fail to improve.   Clotilda JONELLE Single, MD

## 2024-10-04 ENCOUNTER — Ambulatory Visit
Admission: RE | Admit: 2024-10-04 | Discharge: 2024-10-04 | Disposition: A | Discharge status: Home / Self Care | Source: Ambulatory Visit | Attending: Family Medicine | Admitting: Family Medicine

## 2024-10-04 DIAGNOSIS — M47812 Spondylosis without myelopathy or radiculopathy, cervical region: Secondary | ICD-10-CM

## 2024-10-04 DIAGNOSIS — M25512 Pain in left shoulder: Secondary | ICD-10-CM

## 2024-10-04 DIAGNOSIS — R202 Paresthesia of skin: Secondary | ICD-10-CM

## 2024-10-04 DIAGNOSIS — M5412 Radiculopathy, cervical region: Secondary | ICD-10-CM

## 2024-10-04 DIAGNOSIS — M79622 Pain in left upper arm: Secondary | ICD-10-CM

## 2024-10-04 DIAGNOSIS — M542 Cervicalgia: Secondary | ICD-10-CM

## 2024-10-07 ENCOUNTER — Ambulatory Visit: Payer: Self-pay | Admitting: Family Medicine

## 2024-10-07 DIAGNOSIS — M47812 Spondylosis without myelopathy or radiculopathy, cervical region: Secondary | ICD-10-CM

## 2024-10-07 DIAGNOSIS — M5412 Radiculopathy, cervical region: Secondary | ICD-10-CM

## 2024-10-08 ENCOUNTER — Ambulatory Visit: Admitting: Family Medicine

## 2024-10-13 ENCOUNTER — Telehealth: Payer: Self-pay | Admitting: Family Medicine

## 2024-10-13 DIAGNOSIS — R0602 Shortness of breath: Secondary | ICD-10-CM

## 2024-10-13 NOTE — Telephone Encounter (Signed)
 Copied from CRM #[id number]. Topic: Referral - Request for Referral >> Oct 13, 2024  7:57 AM Aleatha BROCKS wrote: Did the patient discuss referral with their provider in the last year? No (If No - schedule appointment) (If Yes - send message)  Appointment offered? No  Type of order/referral and detailed reason for visit: Pulmonologist  Preference of office, provider, location: No preference  If referral order, have you been seen by this specialty before? No (If Yes, this issue or another issue? When? Where?  Can we respond through MyChart? Yes

## 2024-10-13 NOTE — Progress Notes (Deleted)
    [facility] [address] [place] [address] Phone: [phone]   Assessment and Plan:     ***    Pertinent previous records reviewed include ***   Follow Up: ***     Subjective:   I, William Bowman, am serving as a Neurosurgeon for Doctor William Bowman   Chief Complaint: right  It band pain    HPI:    07/31/2024 Patient is a 48 year old male with right IT band pain. Patient states that this year it started getting worse around January. 1997 and 2002 and popped his back by twisting and one day he did it and it caused pain. It felt like he broke his pain and in severe pain. It has been an issue since 2001. It starts off as tightness and discomfort but now it is pain. It feels like he is out of alignment on the right side. Pain is located in shoulder, hips, groin, and down the leg. If he relaxes his core the pain in his right leg is intense. Pain wakes him up at night and prevents him from going to sleep.    Duration?chronic about 24 years  Did you have an Injury to cause this pain? Taking Medication for pain? Aleve  Numbness or Tingling? yes Does the pain Radiate? yes Altered gait or use?yes ROM/ impairment of movement?yes   09/03/2024 Patient states pain is the same. Now he has upper trap tightness   10/14/2024 Patient states   Relevant Historical Information: None pertinent  Additional pertinent review of systems negative.   Current Outpatient Medications:    amLODipine  (NORVASC ) 2.5 MG tablet, Take 1 tablet (2.5 mg total) by mouth daily., Disp: 90 tablet, Rfl: 1   cetirizine (ZYRTEC) 10 MG tablet, Take 10 mg by mouth daily as needed for allergies., Disp: , Rfl:    Cyanocobalamin  (VITAMIN B-12) 1000 MCG/15ML LIQD, Take by mouth., Disp: , Rfl:    predniSONE  (DELTASONE ) 10 MG tablet, Take 6 tabs every morning for 3 days, 5 tabs for 3 days, 4 tabs for 2 days, 3 tabs for 2 days, 2 tabs for 2 days, 1 tab for 2 days.,  Disp: 53 tablet, Rfl: 0   triamcinolone  ointment (KENALOG ) 0.5 %, Apply 1 Application topically 2 (two) times daily., Disp: 60 g, Rfl: 2   Vitamin D , Ergocalciferol , (DRISDOL ) 1.25 MG (50000 UNIT) CAPS capsule, Take 1 capsule (50,000 Units total) by mouth every 7 (seven) days., Disp: 12 capsule, Rfl: 0   Objective:     There were no vitals filed for this visit.    There is no height or weight on file to calculate BMI.    Physical Exam:    ***   Electronically signed by:  William [facility]man D.[facility] 12:14 PM 10/13/24

## 2024-10-13 NOTE — Telephone Encounter (Signed)
 Referral placed per Dr. Mercer, patient was sent a message in MyChart

## 2024-10-14 ENCOUNTER — Ambulatory Visit: Admitting: Sports Medicine

## 2024-11-07 ENCOUNTER — Other Ambulatory Visit

## 2024-11-07 ENCOUNTER — Ambulatory Visit: Admitting: Gastroenterology

## 2024-11-07 ENCOUNTER — Encounter: Payer: Self-pay | Admitting: Gastroenterology

## 2024-11-07 VITALS — BP 128/66 | HR 94 | Ht 67.0 in | Wt 243.4 lb

## 2024-11-07 DIAGNOSIS — R140 Abdominal distension (gaseous): Secondary | ICD-10-CM | POA: Diagnosis not present

## 2024-11-07 DIAGNOSIS — Z860100 Personal history of colon polyps, unspecified: Secondary | ICD-10-CM

## 2024-11-07 MED ORDER — DOXYCYCLINE HYCLATE 100 MG PO CAPS: 100.0000 mg | ORAL_CAPSULE | Freq: Two times a day (BID) | ORAL | 0 refills | Status: AC

## 2024-11-07 NOTE — Progress Notes (Signed)
 Chief Complaint: Bloating Primary GI MD: Dr. Leigh  HPI: Discussed the use of AI scribe software for clinical note transcription with the patient, who gave verbal consent to proceed.  William Bowman is a 48 year old male who presents with chronic bloating and right-sided abdominal pain.  He has experienced a chronic feeling of bloating for years, with a notable worsening over the past year. The bloating is constant, not influenced by food intake, and persists even during fasting. He describes a sensation of tightness in the groin and stomach areas, along with difficulty in digestion.  He experiences right-sided abdominal pain, particularly in the lower back and right lower quadrant, exacerbated by deep breaths. The pain is chronic and constant, not relieved by bowel movements. Despite undergoing two MRIs, no relief or clear diagnosis has been achieved.   regular daily bowel movements. His stools are generally soft and formed, occasionally loose, but never hard. No rectal bleeding, unintentional weight loss, nausea, or vomiting is present.  His symptoms improved with a prednisone  regimen, aiding bowel evacuation and reducing right-sided pain. He has not identified specific dietary triggers, although he consumes dairy and has not attempted a full dairy elimination diet.  Past medical evaluations include a colonoscopy in 2020. No abdominal imaging has been conducted, and previous lab tests have been normal.  He has tried various dietary modifications, including a pseudo keto diet and a low FODMAP diet, without significant improvement. 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 90 tablet 1   cetirizine (ZYRTEC) 10 MG tablet Take 10 mg by mouth daily as needed for allergies.     Cyanocobalamin  (VITAMIN B-12) 1000 MCG/15ML LIQD Take by mouth.     doxycycline (VIBRAMYCIN) 100 MG capsule Take 1 capsule (100 mg total) by mouth 2 (two) times daily for 14 days. 28 capsule 0   triamcinolone  ointment (KENALOG ) 0.5 % Apply 1 Application topically 2 (two) times daily. 60 g 2   predniSONE  (DELTASONE ) 10 MG tablet Take 6 tabs every morning for 3 days, 5 tabs for 3 days, 4 tabs for 2 days, 3 tabs for 2 days, 2 tabs for 2 days, 1 tab for 2 days. (Patient not taking: Reported on 11/07/2024) 53 tablet 0   Vitamin D , Ergocalciferol , (DRISDOL ) 1.25 MG (50000 UNIT) CAPS capsule Take 1 capsule (50,000 Units total) by mouth every 7 (seven) days. (Patient not taking: Reported on 11/07/2024) 12 capsule 0   No current facility-administered medications for this visit.    Allergies as of 11/07/2024   (No Known Allergies)    Family History  Adopted: Yes  Problem Relation Age of Onset   Diabetes Mother    Pancreatic cancer Maternal Aunt    Heart disease Maternal Aunt    Diabetes Maternal Uncle  Colon cancer Neg Hx    Stomach cancer Neg Hx    Rectal cancer Neg Hx     Social History   Socioeconomic History   Marital status: Single    Spouse name: Not on file   Number of children: Not on file   Years of education: Not on file   Highest education level: Not on file  Occupational History   Not on file  Tobacco Use   Smoking status: Former    Types: Cigarettes   Smokeless tobacco: Never  Vaping Use   Vaping status: Never Used  Substance and Sexual Activity   Alcohol use: Yes     Comment: ocassionally beer and/or liquor   Drug use: Yes    Frequency: 2.0 times per week    Types: Marijuana    Comment: last smoked 12/02/2019   Sexual activity: Not on file  Other Topics Concern   Not on file  Social History Narrative   Not on file   Social Drivers of Health   Financial Resource Strain: Not on file  Food Insecurity: Not on file  Transportation Needs: Not on file  Physical Activity: Not on file  Stress: Not on file  Social Connections: Not on file  Intimate Partner Violence: Not on file    Review of Systems:    Constitutional: No weight loss, fever, chills, weakness or fatigue HEENT: Eyes: No change in vision               Ears, Nose, Throat:  No change in hearing or congestion Skin: No rash or itching Cardiovascular: No chest pain, chest pressure or palpitations   Respiratory: No SOB or cough Gastrointestinal: See HPI and otherwise negative Genitourinary: No dysuria or change in urinary frequency Neurological: No headache, dizziness or syncope Musculoskeletal: No new muscle or joint pain Hematologic: No bleeding or bruising Psychiatric: No history of depression or anxiety    Physical Exam:  Vital signs: BP 128/66   Pulse 94   Ht 5' 7 (1.702 m)   Wt 243 lb 6 oz (110.4 kg)   BMI 38.12 kg/m   Constitutional: NAD, alert and cooperative Head:  Normocephalic and atraumatic. Eyes:   PEERL, EOMI. No icterus. Conjunctiva pink. Respiratory: Respirations even and unlabored. Lungs clear to auscultation bilaterally.   No wheezes, crackles, or rhonchi.  Cardiovascular:  Regular rate and rhythm. No peripheral edema, cyanosis or pallor.  Gastrointestinal:  Soft, nondistended, nontender. No rebound or guarding. Normal bowel sounds. No appreciable masses or hepatomegaly. Rectal:  Declines Msk:  Symmetrical without gross deformities. Without edema, no deformity or joint abnormality.  Neurologic:  Alert and  oriented x4;  grossly normal neurologically.  Skin:    Dry and intact without significant lesions or rashes. Psychiatric: Oriented to person, place and time. Demonstrates good judgement and reason without abnormal affect or behaviors.  RELEVANT LABS AND IMAGING: CBC    Component Value Date/Time   WBC 6.1 09/22/2024 1943   RBC 4.43 09/22/2024 1943   HGB 13.8 09/22/2024 1943   HGB 13.9 08/19/2019 0935   HCT 41.3 09/22/2024 1943   HCT 39.9 08/19/2019 0935   PLT 265 09/22/2024 1943   PLT 219 08/19/2019 0935   MCV 93.2 09/22/2024 1943   MCV 94 08/19/2019 0935   MCH 31.2 09/22/2024 1943   MCHC 33.4 09/22/2024 1943   RDW 12.6 09/22/2024 1943   RDW 13.0 08/19/2019 0935   LYMPHSABS 2.4 07/07/2024 1152   LYMPHSABS 1.9 08/19/2019 0935   MONOABS 0.4  07/07/2024 1152   EOSABS 0.2 07/07/2024 1152   EOSABS 0.1 08/19/2019 0935   BASOSABS 0.0 07/07/2024 1152   BASOSABS 0.0 08/19/2019 0935    CMP     Component Value Date/Time   NA 139 09/22/2024 1943   NA 137 08/19/2019 0935   K 3.5 09/22/2024 1943   CL 102 09/22/2024 1943   CO2 24 09/22/2024 1943   GLUCOSE 109 (H) 09/22/2024 1943   BUN 12 09/22/2024 1943   BUN 13 08/19/2019 0935   CREATININE 0.73 09/22/2024 1943   CALCIUM 9.6 09/22/2024 1943   PROT 7.1 07/07/2024 1152   PROT 6.7 08/19/2019 0935   ALBUMIN 4.5 07/07/2024 1152   ALBUMIN 4.4 08/19/2019 0935   AST 16 07/07/2024 1152   ALT 20 07/07/2024 1152   ALKPHOS 45 07/07/2024 1152   BILITOT 0.4 07/07/2024 1152   BILITOT 0.3 08/19/2019 0935   GFRNONAA >60 09/22/2024 1943   GFRAA 130 08/19/2019 0935     Assessment/Plan:   Bloating Chronic bloating ongoing for many years worsened over this last year.  Difficulty adhering to FODMAP diet.  Regular bowel movements per day.  Bloating is not necessarily worse with eating.  Diet high in dairy.  No identifiable food trigger.  Improvement on prednisone .  With his bloating improving on prednisone  suspect there is an inflammatory component.  Reassuring colonoscopy in 2020 with no evidence of  IBD/inflammation.  Patient has a longstanding history of allergies.  DDx includes food sensitivity/allergy  to dairy versus SIBO.  Recent CRP/sed rate normal. - Trial of doxycycline 100 mg twice daily for 14 days empiric treatment of SIBO (patient declines Flagyl due to cessation of alcohol that has to occur) - If no improvement on doxycycline, would recommend dairy elimination diet for 2 weeks - If no improvement on dairy elimination would recommend close adherence to FODMAP diet - Follow-up with me in 6 to 8 weeks - TTG IgA  History of colon polyps Colonoscopy 2020 with a recall of 7 years - Recall colonoscopy 2027   Nestor Mollie RIGGERS [facility] 11/07/2024, 11:46 AM  Cc: Mercer Clotilda SAUNDERS, MD

## 2024-11-07 NOTE — Patient Instructions (Addendum)
 FODMAP stands for fermentable oligo-, di-, mono-saccharides and polyols (1). These are the scientific terms used to classify groups of carbs that are difficult for our body to digest and that are notorious for triggering digestive symptoms like bloating, gas, loose stools and stomach pain.   You can try low FODMAP diet  - start with eliminating just one column at a time that you feel may be a trigger for you. - the table at the very bottom contains foods that are low in FODMAPs   Sometimes trying to eliminate the FODMAP's from your diet is difficult or tricky, if you are stuggling with trying to do the elimination diet you can try an enzyme.  There is a food enzymes that you sprinkle in or on your food that helps break down the FODMAP. You can read more about the enzyme by going to this site: https://fodzyme.com/    Small intestinal bacterial overgrowth (SIBO) occurs when there is an abnormal increase in the overall bacterial population in the small intestine -- particularly types of bacteria not commonly found in that part of the digestive tract. Small intestinal bacterial overgrowth (SIBO) commonly results when a circumstance -- such as surgery or disease -- slows the passage of food and waste products in the digestive tract, creating a breeding ground for bacteria.  Signs and symptoms of SIBO often include: Loss of appetite Abdominal pain Nausea Bloating An uncomfortable feeling of fullness after eating Diarrhea or constipation, depending on the type of gas produced  What foods trigger SIBO? While foods aren't the original cause of SIBO, certain foods do encourage the overgrowth of the wrong bacteria in your small intestine. If you're feeding them their favorite foods, they're going to grow more, and that will trigger more of your SIBO symptoms. By the same token, you can help reduce the overgrowth by starving the problematic bacteria of their favorite foods. This strategy has led to  a number of proposed SIBO eating plans. The plans vary, and so do individual results. But in general, they tend to recommend limiting carbohydrates.  These include: Sugars and sweeteners. Fruits and starchy vegetables. Dairy products. Grains.  There is a test for this we can do called a breath test, if you are positive we will treat you with an antibiotic to see if it helps.  Your symptoms are very suspicious for this condition, as discussed, we will start you on an antibiotic to see if this helps.   We have sent the following medications to your pharmacy for you to pick up at your convenience: Doxycyline.    _______________________________________________________  If your blood pressure at your visit was 140/90 or greater, please contact your primary care physician to follow up on this.  _______________________________________________________  If you are age 49 or older, your body mass index should be between 23-30. Your Body mass index is 38.12 kg/m. If this is out of the aforementioned range listed, please consider follow up with your Primary Care Provider.  If you are age 2 or younger, your body mass index should be between 19-25. Your Body mass index is 38.12 kg/m. If this is out of the aformentioned range listed, please consider follow up with your Primary Care Provider.   ________________________________________________________  The [facility] providers would like to encourage you to use MYCHART to communicate with providers for non-urgent requests or questions.  Due to long hold times on the telephone, sending your provider a message by [facility] may be a faster and more efficient way  to get a response.  Please allow 48 business hours for a response.  Please remember that this is for non-urgent requests.  _______________________________________________________  [facility] is using a team-based approach to care.  Your team is made up of your doctor and two to three  APPS. Our APPS (Nurse Practitioners and Physician Assistants) work with your physician to ensure care continuity for you. They are fully qualified to address your health concerns and develop a treatment plan. They communicate directly with your gastroenterologist to care for you. Seeing the Advanced Practice Practitioners on your physician's team can help you by facilitating care more promptly, often allowing for earlier appointments, access to diagnostic testing, procedures, and other specialty referrals.

## 2024-11-08 NOTE — Progress Notes (Signed)
 William Bowman I have a few additional thoughts: - agree with testing for celiac - sounds like a chronic process bothering him for years, but has failed to improve despite a variety of treatments / dietary changes other than steroids. May want to consider CT abdomen / pelvis if this really bothers him and he thinks he reliably finds relief with steroids, or consider an EGD - I'd do breath testing for him prior to giving empiric doxycycline - that is not something I routinely would use for this. Would rather get him a sample of Rifaximin for 2 weeks if you are going to use antibiotics.  - could give him trial of FODZYME supplement we have in the office to see if that helps, we have free samples

## 2024-11-10 ENCOUNTER — Ambulatory Visit: Payer: Self-pay | Admitting: Gastroenterology

## 2024-11-10 LAB — TISSUE TRANSGLUTAMINASE, IGA: (tTG) Ab, IgA: <1 U/mL

## 2024-11-10 LAB — IGA: Immunoglobulin A: 210 mg/dL (ref 47–310)

## 2024-11-25 ENCOUNTER — Ambulatory Visit: Admitting: Physician Assistant

## 2024-11-28 ENCOUNTER — Ambulatory Visit: Admitting: Pulmonary Disease

## 2024-12-08 ENCOUNTER — Ambulatory Visit: Admitting: Pulmonary Disease

## 2024-12-29 ENCOUNTER — Ambulatory Visit: Admitting: Gastroenterology

## 2024-12-30 ENCOUNTER — Ambulatory Visit: Admitting: Pulmonary Disease

## 2024-12-30 ENCOUNTER — Encounter: Payer: Self-pay | Admitting: Pulmonary Disease

## 2024-12-30 VITALS — BP 150/97 | HR 106 | Temp 98.5°F | Ht 68.0 in | Wt 242.0 lb

## 2024-12-30 DIAGNOSIS — R079 Chest pain, unspecified: Secondary | ICD-10-CM

## 2024-12-30 DIAGNOSIS — R0602 Shortness of breath: Secondary | ICD-10-CM

## 2024-12-30 LAB — CBC WITH DIFFERENTIAL/PLATELET
Basophils Absolute: 0 K/uL (ref 0.0–0.1)
Basophils Relative: 0.3 % (ref 0.0–3.0)
Eosinophils Absolute: 0 K/uL (ref 0.0–0.7)
Eosinophils Relative: 0.6 % (ref 0.0–5.0)
HCT: 42.3 % (ref 39.0–52.0)
Hemoglobin: 14.5 g/dL (ref 13.0–17.0)
Lymphocytes Relative: 25.2 % (ref 12.0–46.0)
Lymphs Abs: 1.5 K/uL (ref 0.7–4.0)
MCHC: 34.2 g/dL (ref 30.0–36.0)
MCV: 94.6 fl (ref 78.0–100.0)
Monocytes Absolute: 0.5 K/uL (ref 0.1–1.0)
Monocytes Relative: 8.1 % (ref 3.0–12.0)
Neutro Abs: 3.9 K/uL (ref 1.4–7.7)
Neutrophils Relative %: 65.8 % (ref 43.0–77.0)
Platelets: 206 K/uL (ref 150.0–400.0)
RBC: 4.47 Mil/uL (ref 4.22–5.81)
RDW: 13.5 % (ref 11.5–15.5)
WBC: 5.9 K/uL (ref 4.0–10.5)

## 2024-12-30 LAB — POCT EXHALED NITRIC OXIDE: FeNO level (ppb): 70 (ref ?–50)

## 2024-12-30 MED ORDER — BUDESONIDE-FORMOTEROL FUMARATE 160-4.5 MCG/ACT IN AERO: 2.0000 | INHALATION_SPRAY | Freq: Two times a day (BID) | RESPIRATORY_TRACT | 11 refills | Status: AC

## 2024-12-30 NOTE — Progress Notes (Signed)
 "              William Bowman    [id number]    07/19/1976  Primary Care Physician:Banks, Clotilda SAUNDERS, MD  Referring Physician: Mercer Clotilda SAUNDERS, MD [address] [place],  [place] [address]  Chief complaint: Consult for dyspnea, chest, arm pain  HPI: 49 y.o. who  has a past medical history of Allergy , Anxiety, Asthma, Depression, Eczema, and Pneumonia.  Discussed the use of AI scribe software for clinical note transcription with the patient, who gave verbal consent to proceed.  History of Present Illness William Bowman is a 49 year old male who presents with a strange sensation in the left chest and arm. He was referred by Dr. Mercer for evaluation of a strange sensation in the left chest and arm.  Left chest and arm sensation - Strange, nonpainful skipping sensation localized to the left chest - Intermittent radiation of sensation down the left arm - Associated with mild numbness in the left arm, without weakness - Symptoms began several months ago - Occurs primarily when sitting and relaxed - No symptoms during physical activity - Emergency department evaluation in September included normal troponin and chest x-ray and EKG - Given prednisone  in October for cervical radiculopathy with temporary improvement in symptoms  Dyspnea and asthma symptoms - Difficulty taking a full breath, especially when lying down - History of asthma - No recent use of inhalers; previously used Symbicort  in 2020 - Currently only takes over-the-counter antihistamines - Major weight gain, believes excess weight affects breathing   Relevant Pulmonary history: Pets: No pets 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  No feather pillows or comforters No h/o chemo/XRT/amiodarone/macrodantin/MTX  No exposure to asbestos, silica or other organic allergens  Smoking history:Smokes marijuana almost daily. No cigarette use Travel history: Originally from [place] .  No significant  travel history Family history: No family history of lung disease   Outpatient Encounter Medications as of 12/30/2024  Medication Sig   cetirizine (ZYRTEC) 10 MG tablet Take 10 mg by mouth daily as needed for allergies.   Cyanocobalamin  (VITAMIN B-12) 1000 MCG/15ML LIQD Take by mouth.   triamcinolone  ointment (KENALOG ) 0.5 % Apply 1 Application topically 2 (two) times daily.   amLODipine  (NORVASC ) 2.5 MG tablet Take 1 tablet (2.5 mg total) by mouth daily. (Patient not taking: Reported on 12/30/2024)   predniSONE  (DELTASONE ) 10 MG tablet Take 6 tabs every morning for 3 days, 5 tabs for 3 days, 4 tabs for 2 days, 3 tabs for 2 days, 2 tabs for 2 days, 1 tab for 2 days. (Patient not taking: Reported on 12/30/2024)   Vitamin D , Ergocalciferol , (DRISDOL ) 1.25 MG (50000 UNIT) CAPS capsule Take 1 capsule (50,000 Units total) by mouth every 7 (seven) days. (Patient not taking: Reported on 12/30/2024)   No facility-administered encounter medications on file as of 12/30/2024.     Physical Exam: Today's Vitals   12/30/24 0829  BP: (!) 150/97  Pulse: (!) 106  Temp: 98.5 F (36.9 C)  TempSrc: Oral  SpO2: 97%  Weight: 242 lb (109.8 kg)  Height: 5' 8 (1.727 m)   Body mass index is 36.8 kg/m.  Physical Exam GEN: No acute distress CV: Regular rate and rhythm no murmurs LUNGS: Clear to auscultation bilaterally normal respiratory effort SKIN JOINTS: Warm and dry no rash   Data Reviewed: Imaging: Chest x-ray 09/22/2024-no active cardiopulmonary disease I reviewed the images personally.  PFTs:  FENO 12/30/2024-70  Labs:  Assessment and Plan Assessment & Plan Asthma Elevated FENO level at 70, indicating eosinophilic airway inflammation. Symptoms improved with prednisone , suggesting a possible asthma component. No current inhaler use, but previous use of Symbicort  in 2020. - Prescribed Symbicort  or similar inhaler - Order PFTs on return visit - Ordered blood tests to check for inflammation levels,  CBC with differential, IgE  Evaluation for possible angina Intermittent left-sided chest sensation radiating to the arm, atypical for angina but requires evaluation due to potential cardiac involvement. Previous ED visit with normal EKG and chest x-ray. - Referred to cardiologist for further evaluation  Evaluation for possible pulmonary embolism Atypical chest sensation could be due to a blood clot. D-dimer test planned to check for presence of clots. - Ordered D-dimer test - If D-dimer is positive, will order CTA chest  Recommendations: Symbicort , PFTs CBC differential, IgE, D-dimer Cardiology referral  I personally spent a total of 60 minutes in the care of the patient today including preparing to see the patient, getting/reviewing separately obtained history, performing a medically appropriate exam/evaluation, counseling and educating, documenting clinical information in the EHR, communicating results, and coordinating care.   Rayder Sullenger MD [facility] 12/30/2024, 8:45 AM  CC: Mercer Clotilda SAUNDERS, MD   "

## 2024-12-30 NOTE — Patient Instructions (Signed)
" °  VISIT SUMMARY: You visited us  today due to a strange sensation in your left chest and arm, and difficulty breathing, especially when lying down. We discussed your asthma and evaluated the possibility of other conditions such as angina and pulmonary embolism.  YOUR PLAN: ASTHMA: You have a history of asthma and currently experience difficulty taking a full breath, especially when lying down. Your symptoms improved with prednisone , indicating inflammation. -We have prescribed Symbicort  or a similar inhaler for you to use. -We have ordered blood tests to check for inflammation levels.  EVALUATION FOR POSSIBLE ANGINA: You have an intermittent left-sided chest sensation that radiates to your arm. While this is atypical for angina, it requires further evaluation. -We have referred you to a cardiologist for further evaluation.  EVALUATION FOR POSSIBLE PULMONARY EMBOLISM: The strange chest sensation could be due to a blood clot. -We have ordered a D-dimer test to check for the presence of clots. -If the D-dimer test is positive, we will order a CT scan of your chest.                                 Contains text generated by [employer].   "

## 2024-12-30 NOTE — Progress Notes (Deleted)
 " Cardiology Office Note:    Date:  12/30/2024   ID:  Rodgers Glatter, DOB 12/21/1976, MRN [id number]  PCP:  Mercer Clotilda SAUNDERS, MD   [facility] Providers Cardiologist:  None { Click to update primary MD,subspecialty MD or APP then REFRESH:1}    Referring MD: Mannam, Praveen, MD   No chief complaint on file. ***  History of Present Illness:    William Bowman is a 49 y.o. male with a hx of anxiety, asthma, and eczema. He follows with pulmonology and at last visit complained of chest pain. He was referred to cardiology for further evaluation.           Past Medical History:  Diagnosis Date   Allergy     Anxiety    Asthma    Depression    Eczema    Pneumonia     Past Surgical History:  Procedure Laterality Date   NO PAST SURGERIES      Current Medications: Active Medications[1]   Allergies:   Patient has no known allergies.   Social History   Socioeconomic History   Marital status: Single    Spouse name: Not on file   Number of children: Not on file   Years of education: Not on file   Highest education level: Not on file  Occupational History   Not on file  Tobacco Use   Smoking status: Some Days    Types: Cigarettes   Smokeless tobacco: Never  Vaping Use   Vaping status: Never Used  Substance and Sexual Activity   Alcohol use: Yes    Comment: ocassionally beer and/or liquor   Drug use: Yes    Frequency: 2.0 times per week    Types: Marijuana    Comment: last smoked 12/02/2019   Sexual activity: Not on file  Other Topics Concern   Not on file  Social History Narrative   Not on file   Social Drivers of Health   Tobacco Use: High Risk (12/30/2024)   Patient History    Smoking Tobacco Use: Some Days    Smokeless Tobacco Use: Never    Passive Exposure: Not on file  Financial Resource Strain: Not on file  Food Insecurity: Not on file  Transportation Needs: Not on file  Physical Activity: Not on file  Stress: Not on file  Social  Connections: Not on file  Depression (PHQ2-9): Low Risk (09/24/2024)   Depression (PHQ2-9)    PHQ-2 Score: 4  Alcohol Screen: Not on file  Housing: Not on file  Utilities: Not on file  Health Literacy: Not on file     Family History: The patient's ***family history includes Diabetes in his maternal uncle and mother; Heart disease in his maternal aunt; Pancreatic cancer in his maternal aunt. There is no history of Colon cancer, Stomach cancer, or Rectal cancer. He was adopted.  ROS:   Please see the history of present illness.    *** All other systems reviewed and are negative.  EKGs/Labs/Other Studies Reviewed:    The following studies were reviewed today: ***      Recent Labs: 07/07/2024: ALT 20; TSH 1.35 09/22/2024: BUN 12; Creatinine, Ser 0.73; Potassium 3.5; Sodium 139 12/30/2024: Hemoglobin 14.5; Platelets 206.0  Recent Lipid Panel    Component Value Date/Time   CHOL 200 07/07/2024 1152   TRIG 127.0 07/07/2024 1152   HDL 53.60 07/07/2024 1152   CHOLHDL 4 07/07/2024 1152   VLDL 25.4 07/07/2024 1152   LDLCALC 121 (H) 07/07/2024 1152  Risk Assessment/Calculations:   {Does this patient have ATRIAL FIBRILLATION?:[id number]}            Physical Exam:    VS:  There were no vitals taken for this visit.    Wt Readings from Last 3 Encounters:  12/30/24 242 lb (109.8 kg)  11/07/24 243 lb 6 oz (110.4 kg)  09/24/24 240 lb 12.8 oz (109.2 kg)     GEN: *** Well nourished, well developed in no acute distress HEENT: Normal NECK: No JVD; No carotid bruits LYMPHATICS: No lymphadenopathy CARDIAC: ***RRR, no murmurs, rubs, gallops RESPIRATORY:  Clear to auscultation without rales, wheezing or rhonchi  ABDOMEN: Soft, non-tender, non-distended MUSCULOSKELETAL:  No edema; No deformity  SKIN: Warm and dry NEUROLOGIC:  Alert and oriented x 3 PSYCHIATRIC:  Normal affect   ASSESSMENT:    No diagnosis found. PLAN:    In order of problems listed above:  ***       {Are you ordering a CV Procedure (e.g. stress test, cath, DCCV, TEE, etc)?   Press F2        :[id number]}    Medication Adjustments/Labs and Tests Ordered: Current medicines are reviewed at length with the patient today.  Concerns regarding medicines are outlined above.  No orders of the defined types were placed in this encounter.  No orders of the defined types were placed in this encounter.   There are no Patient Instructions on file for this visit.   Signed, Jon Nat Hails, PA  12/30/2024 4:51 PM    [facility]     [1]  No outpatient medications have been marked as taking for the 12/31/24 encounter (Appointment) with Hails Jon Nat, PA.   "

## 2024-12-31 ENCOUNTER — Ambulatory Visit: Admitting: Physician Assistant

## 2025-01-01 ENCOUNTER — Ambulatory Visit: Payer: Self-pay | Admitting: Pulmonary Disease

## 2025-01-01 LAB — IGE: IgE (Immunoglobulin E), Serum: 455 kU/L — ABNORMAL HIGH

## 2025-01-01 LAB — D-DIMER, QUANTITATIVE: D-Dimer, Quant: 0.22 ug{FEU}/mL

## 2025-01-05 ENCOUNTER — Other Ambulatory Visit (HOSPITAL_COMMUNITY): Payer: Self-pay

## 2025-01-05 ENCOUNTER — Ambulatory Visit: Attending: Cardiology | Admitting: Cardiology

## 2025-01-05 ENCOUNTER — Telehealth: Payer: Self-pay

## 2025-01-05 ENCOUNTER — Ambulatory Visit: Attending: Cardiology

## 2025-01-05 VITALS — BP 128/76 | HR 109 | Ht 68.0 in | Wt 244.0 lb

## 2025-01-05 DIAGNOSIS — I10 Essential (primary) hypertension: Secondary | ICD-10-CM | POA: Insufficient documentation

## 2025-01-05 DIAGNOSIS — R0789 Other chest pain: Secondary | ICD-10-CM | POA: Diagnosis not present

## 2025-01-05 DIAGNOSIS — I479 Paroxysmal tachycardia, unspecified: Secondary | ICD-10-CM | POA: Insufficient documentation

## 2025-01-05 NOTE — Progress Notes (Unsigned)
 Enrolled for [employer] to mail a ZIO XT long term holter monitor to the patients address on file.

## 2025-01-05 NOTE — Patient Instructions (Signed)
 Medication Instructions:   No changes  *If you need a refill on your cardiac medications before your next appointment, please call your pharmacy*   Lab Work: Not needed    Testing/Procedures: Your physician has recommended that you wear a holter monitor 3 day Zio. Holter monitors are medical devices that record the hearts electrical activity. Doctors most often use these monitors to diagnose arrhythmias. Arrhythmias are problems with the speed or rhythm of the heartbeat. The monitor is a small, portable device. You can wear one while you do your normal daily activities. This is usually used to diagnose what is causing palpitations/syncope (passing out).     Follow-Up: At [facility], you and your health needs are our priority.  As part of our continuing mission to provide you with exceptional heart care, we have created designated Provider Care Teams.  These Care Teams include your primary Cardiologist (physician) and Advanced Practice Providers (APPs -  Physician Assistants and Nurse Practitioners) who all work together to provide you with the care you need, when you need it.     Your next appointment:   4 to 6 week(s)  The format for your next appointment:   Virtual Visit   Provider:   Alm Clay, MD   Other Instructions  ZIO XT- Long Term Monitor Instructions  Your physician has requested you wear a ZIO patch monitor for 3 days.  This is a single patch monitor. [employer] supplies one patch monitor per enrollment. Additional stickers are not available. Please do not apply patch if you will be having a Nuclear Stress Test,  Echocardiogram, Cardiac CT, MRI, or Chest Xray during the period you would be wearing the  monitor. The patch cannot be worn during these tests. You cannot remove and re-apply the  ZIO XT patch monitor.  Your ZIO patch monitor will be mailed 3 day USPS to your address on file. It may take 3-5 days  to receive your monitor after you have been enrolled.   Once you have received your monitor, please review the enclosed instructions. Your monitor  has already been registered assigning a specific monitor serial # to you.  Billing and Patient Assistance Program Information  We have supplied [employer] with any of your insurance information on file for billing purposes. [employer] offers a sliding scale Patient Assistance Program for patients that do not have  insurance, or whose insurance does not completely cover the cost of the ZIO monitor.  You must apply for the Patient Assistance Program to qualify for this discounted rate.  To apply, please call [employer] at [phone], select option 4, select option 2, ask to apply for  Patient Assistance Program. Meredeth will ask your household income, and how many people  are in your household. They will quote your out-of-pocket cost based on that information.  [employer] will also be able to set up a 61-month, interest-free payment plan if needed.  Applying the monitor   Shave hair from upper left chest.  Hold abrader disc by orange tab. Rub abrader in 40 strokes over the upper left chest as  indicated in your monitor instructions.  Clean area with 4 enclosed alcohol pads. Let dry.  Apply patch as indicated in monitor instructions. Patch will be placed under collarbone on left  side of chest with arrow pointing upward.  Rub patch adhesive wings for 2 minutes. Remove white label marked 1. Remove the white  label marked 2. Rub patch adhesive wings for 2 additional minutes.  While looking in  a mirror, press and release button in center of patch. A small green light will  flash 3-4 times. This will be your only indicator that the monitor has been turned on.  Do not shower for the first 24 hours. You may shower after the first 24 hours.  Press the button if you feel a symptom. You will hear a small click. Record Date, Time and  Symptom in the Patient Logbook.  When you are ready to remove the patch, follow  instructions on the last 2 pages of Patient  Logbook. Stick patch monitor onto the last page of Patient Logbook.  Place Patient Logbook in the blue and white box. Use locking tab on box and tape box closed  securely. The blue and white box has prepaid postage on it. Please place it in the mailbox as  soon as possible. Your physician should have your test results approximately 7 days after the  monitor has been mailed back to [facility].  Call [facility] Customer Care at [phone] if you have questions regarding  your ZIO XT patch monitor. Call them immediately if you see an orange light blinking on your  monitor.  If your monitor falls off in less than 4 days, contact our Monitor department at [phone].  If your monitor becomes loose or falls off after 4 days call [employer] at [phone] for  suggestions on securing your monitor

## 2025-01-05 NOTE — Telephone Encounter (Signed)
*  Pulm  Pharmacy Patient Advocate Encounter   Received notification from Fax that prior authorization for Budesonide -Formoterol  Fumarate 160-4.5MCG/ACT aerosol   is required/requested.   Insurance verification completed.   The patient is insured through [employer].   Per test claim:  Brand Symbicort  is preferred by the insurance.  If suggested medication is appropriate, Please send in a new RX and discontinue this one. If not, please advise as to why it's not appropriate so that we may request a Prior Authorization. Please note, some preferred medications may still require a PA.  If the suggested medications have not been trialed and there are no contraindications to their use, the PA will not be submitted, as it will not be approved.   Key: [id number]

## 2025-01-05 NOTE — Progress Notes (Unsigned)
 " Cardiology Office Note:  .   Date:  01/08/2025  ID:  Rodgers Glatter, DOB Aug 15, 1976, MRN [id number] PCP: Mercer Clotilda SAUNDERS, MD  Pulmonologist: Dr. Theophilus [facility] Providers Cardiologist:  Alm Clay, MD     Chief Complaint  Patient presents with   New Patient (Initial Visit)    Evaluation of shortness of breath, left-sided chest chest/shoulder pain, rapid palpitations    Patient Profile: .     Lionardo Haze is a mildly obese, very anxious 49 y.o. male former smoker with a PMH notable for Asthma, dyslipidemia, anxiety and depression with prior 8/oh of pneumonia who presents here for evaluation of left-sided chest pain and shortness of breath at the request of Dr. Theophilus and of Mercer Clotilda SAUNDERS, MD.  Mr. Schachter was seen by Dr. Mercer on September 24, 2024 after an ER visit September 29 with complaints of chest pain he described a pain starting in the left upper chest/neck and shoulder radiating into his neck and down his arm.  Describes a squeezing sensation.  Not made worse with exertion.  Actually noted to be worse during relaxation or resting his elbow on a chair.  He tried massage, over-the-counter medications and physical trainer.  Dr. 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  She started a prednisone  taper along with Toradol  and ordered a C-spine MRI with plans to proceed with PT.  He had been referred to pulmonary medicine for dyspnea.     Camilo Mander was seen by Dr. Theophilus on December 30, 2024 for evaluation of dyspnea but noted a strange left chest and arm discomfort.  He was concerned that this could have been a potential anginal equivalent and referred to cardiology for possible atypical angina.  He also ordered a D-dimer to exclude PE-it was negative so no CT scan ordered.  At this visit, he noted improved symptoms on prednisone  suggesting a  possible asthma component.  He prescribed Symbicort  inhaler with as needed albuterol .  Subjective  Discussed the use of AI scribe software for clinical note transcription with the patient, who gave verbal consent to proceed.  History of Present Illness Loney Domingo is a 49 year old male who presents with a sensation in his chest and arm. He was referred by Dr. Mercer and Dr. Theophilus for a cardiology evaluation.  Interestingly, he does note that the left sided shoulder/chest discomfort improved with steroids.  However once the steroids wore off, he is having the symptoms again.  He experiences a sensation in his chest, described as a 'skipping' feeling, primarily at rest, accompanied by slight numbness extending down his arm. The episodes last only a second or two and occur without any associated pain. The sensation does not worsen with physical activity and is more noticeable when he is at rest.  He reports waking up with his heart racing most mornings over the past week, although he does not experience shortness of breath or discomfort during physical activity. He mentions having to concentrate on breathing properly, which he attributes to stress. He sleeps with one tripod pillow and several other pillows but does not wake up due to breathing difficulties.  Other than the fast heart rate spells and the left upper chest shoulder and neck discomfort he does not have any significant exertional dyspnea.  No PND orthopnea.  No syncope or near syncope.  Although he notes having some heart racing  episodes when he wakes in the morning, he denies any irregular nature to the palpitations.  He has a history of asthma and recently started using Symbicort . He also takes a low dose of medication for blood pressure and uses Zyrtec for allergies. He completed a prednisone  regimen recently, which he believes helped with his symptoms, although he is unsure why it was prescribed.  He has a family history of diabetes  in his mother and heart disease in his maternal aunt. He is concerned about his recent high blood pressure readings and elevated resting heart rate, which led to his current medication regimen initiated in June. No swelling or shortness of breath when lying flat.  ROS:  Review of Systems - Negative except symptoms noted above but also notable anxiety and stress.  He acknowledges he has a significant Mehta stress and tension.    Objective   Medications: Amlodipine  2.5 mg daily, Symbicort  2 puffs twice daily, Zyrtec 10 mg daily; completed steroid taper.  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  Former smoker.  Occasionally smokes marijuana and drinks maybe 2 alcohol drinks a week.  Studies Reviewed: SABRA        Lab Results  Component Value Date   CHOL 200 07/07/2024   HDL 53.60 07/07/2024   LDLCALC 121 (H) 07/07/2024   TRIG 127.0 07/07/2024   CHOLHDL 4 07/07/2024   Lab Results  Component Value Date   NA 139 09/22/2024   K 3.5 09/22/2024   CREATININE 0.73 09/22/2024   GFRNONAA >60 09/22/2024   GLUCOSE 109 (H) 09/22/2024   Lab Results  Component Value Date   HGBA1C 6.1 07/07/2024   Results Radiology Cervical spine MRI: Multilevel cervical spondylosis, most prominent at C4-C5    Risk Assessment/Calculations:          Physical Exam:   VS:  BP 128/76 (BP Location: Right Arm, Patient Position: Sitting, Cuff Size: Large)   Pulse (!) 109   Ht 5' 8 (1.727 m)   Wt 244 lb (110.7 kg)   SpO2 96%   BMI 37.10 kg/m    Wt Readings from Last 3 Encounters:  01/05/25 244 lb (110.7 kg)  12/30/24 242 lb (109.8 kg)  11/07/24 243 lb 6 oz (110.4 kg)     GEN: Well nourished, well groomed in no acute distress; moderately obese NECK: No JVD; No carotid bruits CARDIAC: Distant heart sounds but mostly normal S1, S2; RRR, no murmurs, rubs, gallops RESPIRATORY:  Clear to auscultation without rales, wheezing or rhonchi ; nonlabored, good air  movement. ABDOMEN: Soft, non-tender, non-distended EXTREMITIES:  Tenderness in the left upper chest/pectoral region as well as shoulder. Painful and tight neck muscles. No clubbing, cyanosis, or edema. Normal pedal pulses.     ASSESSMENT AND PLAN: .   Paroxysmal tachycardia (HCC) Palpitations and elevated resting heart rate Intermittent palpitations and elevated resting heart rate, primarily at rest. Differential includes stress-related tachycardia. Previous ER visit ruled out myocardial infarction. Current symptoms may be stress-related. - Ordered 3-day heart monitor to assess heart rate patterns and correlate with symptoms. - Scheduled follow-up call to discuss heart monitor results.  Primary hypertension Hypertension managed with low-dose amlodipine  2.5 mg daily.  Recent elevated readings noted, but current readings are within acceptable range. Stress and weight management may contribute to variability. - Continue current antihypertensive medication regimen. - Encouraged weight loss and stress management strategies.  Left-sided chest wall pain Somewhat reproducible left-sided upper chest pain not centralized precordial  pain.  Not made worse with exertion.  Most consistent with the diagnosis of Dr. Mercer out of neck muscle and rotator cuff muscle tightness related to cervical nerve impingement. I think he would benefit from heart massage and muscle relaxants.  We talked about his symptoms and the potential evaluating with GXT.  He was relatively reassured by my assurance and felt comfortable without further testing.  He did not think it was his heart, but was more interested in evaluating the faster heart rate episodes.  As such we can hold off on GXT for now and simply move forward with event monitor.   Orders Placed This Encounter  Procedures   LONG TERM MONITOR (3-14 DAYS)           Follow-Up: Return in about 6 weeks (around 02/16/2025) for Routine Follow-up after testing ~ 1-2  months, To discuss test results, Followup with Telemedicine.      Signed, Alm MICAEL Clay, MD, MS Alm Clay, M.D., M.S. Interventional Cardiologist  [facility] Pager # [phone]      "

## 2025-01-07 NOTE — Telephone Encounter (Signed)
 Noted.  He is already on Symbicort  Please let patient know it has been authorized by insurance.

## 2025-01-07 NOTE — Telephone Encounter (Signed)
 Dr Theophilus, any suggestions?

## 2025-01-08 ENCOUNTER — Encounter: Payer: Self-pay | Admitting: Cardiology

## 2025-01-08 DIAGNOSIS — I479 Paroxysmal tachycardia, unspecified: Secondary | ICD-10-CM | POA: Insufficient documentation

## 2025-01-08 DIAGNOSIS — R0789 Other chest pain: Secondary | ICD-10-CM | POA: Insufficient documentation

## 2025-01-08 DIAGNOSIS — I10 Essential (primary) hypertension: Secondary | ICD-10-CM | POA: Insufficient documentation

## 2025-01-08 NOTE — Assessment & Plan Note (Signed)
 Hypertension managed with low-dose amlodipine  2.5 mg daily.  Recent elevated readings noted, but current readings are within acceptable range. Stress and weight management may contribute to variability. - Continue current antihypertensive medication regimen. - Encouraged weight loss and stress management strategies.

## 2025-01-08 NOTE — Assessment & Plan Note (Signed)
 Palpitations and elevated resting heart rate Intermittent palpitations and elevated resting heart rate, primarily at rest. Differential includes stress-related tachycardia. Previous ER visit ruled out myocardial infarction. Current symptoms may be stress-related. - Ordered 3-day heart monitor to assess heart rate patterns and correlate with symptoms. - Scheduled follow-up call to discuss heart monitor results.

## 2025-01-08 NOTE — Telephone Encounter (Signed)
 ATCx1 left detailed message.NFN

## 2025-01-08 NOTE — Assessment & Plan Note (Addendum)
 Somewhat reproducible left-sided upper chest pain not centralized precordial pain.  Not made worse with exertion.  Most consistent with the diagnosis of Dr. Mercer out of neck muscle and rotator cuff muscle tightness related to cervical nerve impingement. I think he would benefit from heart massage and muscle relaxants.  We talked about his symptoms and the potential evaluating with GXT.  He was relatively reassured by my assurance and felt comfortable without further testing.  He did not think it was his heart, but was more interested in evaluating the faster heart rate episodes.  As such we can hold off on GXT for now and simply move forward with event monitor.

## 2025-01-09 NOTE — Telephone Encounter (Signed)
 Called [email or web address]

## 2025-01-29 ENCOUNTER — Telehealth: Payer: Self-pay | Admitting: Cardiology

## 2025-01-29 NOTE — Telephone Encounter (Signed)
 Patient wants to reschedule his MyChart Video visit.

## 2025-01-30 ENCOUNTER — Telehealth: Payer: Self-pay | Admitting: *Deleted

## 2025-01-30 NOTE — Telephone Encounter (Signed)
"  °  Patient Consent for Virtual Visit        William Bowman has provided verbal consent on 01/30/2025 for a virtual visit (video or telephone).   CONSENT FOR VIRTUAL VISIT FOR:  William Bowman  By participating in this virtual visit I agree to the following:  I hereby voluntarily request, consent and authorize [facility] and its employed or contracted physicians, physician assistants, nurse practitioners or other licensed health care professionals (the Practitioner), to provide me with telemedicine health care services (the Services) as deemed necessary by the treating Practitioner. I acknowledge and consent to receive the Services by the Practitioner via telemedicine. I understand that the telemedicine visit will involve communicating with the Practitioner through live audiovisual communication technology and the disclosure of certain medical information by electronic transmission. I acknowledge that I have been given the opportunity to request an in-person assessment or other available alternative prior to the telemedicine visit and am voluntarily participating in the telemedicine visit.  I understand that I have the right to withhold or withdraw my consent to the use of telemedicine in the course of my care at any time, without affecting my right to future care or treatment, and that the Practitioner or I may terminate the telemedicine visit at any time. I understand that I have the right to inspect all information obtained and/or recorded in the course of the telemedicine visit and may receive copies of available information for a reasonable fee.  I understand that some of the potential risks of receiving the Services via telemedicine include:  Delay or interruption in medical evaluation due to technological equipment failure or disruption; Information transmitted may not be sufficient (e.g. poor resolution of images) to allow for appropriate medical decision making by the  Practitioner; and/or  In rare instances, security protocols could fail, causing a breach of personal health information.  Furthermore, I acknowledge that it is my responsibility to provide information about my medical history, conditions and care that is complete and accurate to the best of my ability. I acknowledge that Practitioner's advice, recommendations, and/or decision may be based on factors not within their control, such as incomplete or inaccurate data provided by me or distortions of diagnostic images or specimens that may result from electronic transmissions. I understand that the practice of medicine is not an exact science and that Practitioner makes no warranties or guarantees regarding treatment outcomes. I acknowledge that a copy of this consent can be made available to me via my patient portal [facility] MyChart), or I can request a printed copy by calling the office of  [facility].    I understand that my insurance will be billed for this visit.   I have read or had this consent read to me. I understand the contents of this consent, which adequately explains the benefits and risks of the Services being provided via telemedicine.  I have been provided ample opportunity to ask questions regarding this consent and the Services and have had my questions answered to my satisfaction. I give my informed consent for the services to be provided through the use of telemedicine in my medical care    "

## 2025-01-30 NOTE — Telephone Encounter (Signed)
 Spoke to patient . He decide to keep appointment on 02/02/25   Telehealth consent completed

## 2025-02-02 ENCOUNTER — Ambulatory Visit: Admitting: Cardiology

## 2025-04-06 ENCOUNTER — Encounter

## 2025-04-06 ENCOUNTER — Ambulatory Visit: Admitting: Pulmonary Disease
